# Patient Record
Sex: Male | Born: 1985 | Race: Black or African American | Hispanic: No | Marital: Single | Smoking: Current some day smoker
Health system: Southern US, Community
[De-identification: ages and names within clinical notes are randomized; demographics above are authoritative.]

## PROBLEM LIST (undated history)

## (undated) DIAGNOSIS — U071 COVID-19: Secondary | ICD-10-CM

---

## 2005-03-06 ENCOUNTER — Emergency Department (HOSPITAL_COMMUNITY): Admission: EM | Admit: 2005-03-06 | Discharge: 2005-03-06 | Payer: Self-pay | Admitting: Emergency Medicine

## 2009-07-29 ENCOUNTER — Emergency Department (HOSPITAL_COMMUNITY): Admission: EM | Admit: 2009-07-29 | Discharge: 2009-07-29 | Payer: Self-pay | Admitting: Emergency Medicine

## 2013-11-04 ENCOUNTER — Encounter (HOSPITAL_COMMUNITY): Payer: Self-pay | Admitting: Emergency Medicine

## 2013-11-04 ENCOUNTER — Emergency Department (HOSPITAL_COMMUNITY)
Admission: EM | Admit: 2013-11-04 | Discharge: 2013-11-04 | Disposition: A | Discharge status: Home / Self Care | Payer: Self-pay | Attending: Emergency Medicine | Admitting: Emergency Medicine

## 2013-11-04 DIAGNOSIS — K648 Other hemorrhoids: Secondary | ICD-10-CM | POA: Insufficient documentation

## 2013-11-04 DIAGNOSIS — K649 Unspecified hemorrhoids: Secondary | ICD-10-CM | POA: Insufficient documentation

## 2013-11-04 DIAGNOSIS — F17200 Nicotine dependence, unspecified, uncomplicated: Secondary | ICD-10-CM | POA: Insufficient documentation

## 2013-11-04 DIAGNOSIS — K5900 Constipation, unspecified: Secondary | ICD-10-CM | POA: Insufficient documentation

## 2013-11-04 DIAGNOSIS — K644 Residual hemorrhoidal skin tags: Secondary | ICD-10-CM | POA: Insufficient documentation

## 2013-11-04 MED ORDER — HYDROCODONE-ACETAMINOPHEN 5-325 MG PO TABS
1.0000 | ORAL_TABLET | Freq: Once | ORAL | Status: AC
  Administered 2013-11-04: 1 via ORAL
  Filled 2013-11-04: qty 1

## 2013-11-04 MED ORDER — HYDROCORTISONE ACETATE 25 MG RE SUPP: 25.0000 mg | Freq: Two times a day (BID) | RECTAL | Status: DC

## 2013-11-04 MED ORDER — HYDROCODONE-ACETAMINOPHEN 5-325 MG PO TABS
1.0000 | ORAL_TABLET | Freq: Four times a day (QID) | ORAL | Status: DC | PRN
Start: 2013-11-04 — End: 2016-12-22

## 2013-11-04 NOTE — Discharge Instructions (Signed)
Soak in a tub of warm water for 30 minutes several times a day. Take miralax daily to prevent constipation. Use the suppositories as directed. You need to see a surgeon about your hemorrhoids, I gave you two numbers to call.    Hemorrhoids Hemorrhoids are swollen veins around the rectum or anus. There are two types of hemorrhoids:   Internal hemorrhoids. These occur in the veins just inside the rectum. They may poke through to the outside and become irritated and painful.  External hemorrhoids. These occur in the veins outside the anus and can be felt as a painful swelling or hard lump near the anus. CAUSES  Pregnancy.   Obesity.   Constipation or diarrhea.   Straining to have a bowel movement.   Sitting for long periods on the toilet.  Heavy lifting or other activity that caused you to strain.  Anal intercourse. SYMPTOMS   Pain.   Anal itching or irritation.   Rectal bleeding.   Fecal leakage.   Anal swelling.   One or more lumps around the anus.  DIAGNOSIS  Your caregiver may be able to diagnose hemorrhoids by visual examination. Other examinations or tests that may be performed include:   Examination of the rectal area with a gloved hand (digital rectal exam).   Examination of anal canal using a small tube (scope).   A blood test if you have lost a significant amount of blood.  A test to look inside the colon (sigmoidoscopy or colonoscopy). TREATMENT Most hemorrhoids can be treated at home. However, if symptoms do not seem to be getting better or if you have a lot of rectal bleeding, your caregiver may perform a procedure to help make the hemorrhoids get smaller or remove them completely. Possible treatments include:   Placing a rubber band at the base of the hemorrhoid to cut off the circulation (rubber band ligation).   Injecting a chemical to shrink the hemorrhoid (sclerotherapy).   Using a tool to burn the hemorrhoid (infrared light therapy).    Surgically removing the hemorrhoid (hemorrhoidectomy).   Stapling the hemorrhoid to block blood flow to the tissue (hemorrhoid stapling).  HOME CARE INSTRUCTIONS   Eat foods with fiber, such as whole grains, beans, nuts, fruits, and vegetables. Ask your doctor about taking products with added fiber in them (fibersupplements).  Increase fluid intake. Drink enough water and fluids to keep your urine clear or pale yellow.   Exercise regularly.   Go to the bathroom when you have the urge to have a bowel movement. Do not wait.   Avoid straining to have bowel movements.   Keep the anal area dry and clean. Use wet toilet paper or moist towelettes after a bowel movement.   Medicated creams and suppositories may be used or applied as directed.   Only take over-the-counter or prescription medicines as directed by your caregiver.   Take warm sitz baths for 15-20 minutes, 3-4 times a day to ease pain and discomfort.   Place ice packs on the hemorrhoids if they are tender and swollen. Using ice packs between sitz baths may be helpful.   Put ice in a plastic bag.   Place a towel between your skin and the bag.   Leave the ice on for 15-20 minutes, 3-4 times a day.   Do not use a donut-shaped pillow or sit on the toilet for long periods. This increases blood pooling and pain.  SEEK MEDICAL CARE IF:  You have increasing pain and swelling that  is not controlled by treatment or medicine.  You have uncontrolled bleeding.  You have difficulty or you are unable to have a bowel movement.  You have pain or inflammation outside the area of the hemorrhoids. MAKE SURE YOU:  Understand these instructions.  Will watch your condition.  Will get help right away if you are not doing well or get worse. Document Released: 03/09/2000 Document Revised: 02/27/2012 Document Reviewed: 01/15/2012 [facility] Patient Information 2015 [place], [place]. This information is not intended to  replace advice given to you by your health care provider. Make sure you discuss any questions you have with your health care provider.

## 2013-11-04 NOTE — ED Notes (Signed)
Hemorrhoids per pt. Started over a course of a year, now having problems with them per pt.

## 2013-11-04 NOTE — ED Provider Notes (Signed)
CSN: [id number]     Arrival date & time 11/04/13  2004 History  This chart was scribed for Ward Givens, MD by Milly Jakob, ED Scribe. The patient was seen in room APA03/APA03. Patient's care was started at 9:08 PM.   Chief Complaint  Patient presents with  . Hemorrhoids   The history is provided by the patient. No language interpreter was used.   HPI Comments: Phillip Bolton is a 28 y.o. male who presents to the Emergency Department complaining of an intermittent hemorrhoids which began a year ago. He states that this has become more painful recently. He reports sometimes there is some bleeding mainly when he has a bowel movement. He reports that he believes that he got his hemorrhoid when he was straining to lift a copier. He reports associated constipation. He reports using Epsom Salt soaks in his tub and Preporation H with minimal relief at home. He has never been evaluated for his hemorrhoids before. He states he has some mild constipation at times. He reports smoking about 0.5 PPD. He drinks occasionally. He washes cars for a living.   PCP none   History reviewed. No pertinent past medical history. History reviewed. No pertinent past surgical history. No family history on file. History  Substance Use Topics  . Smoking status: Current Every Day Smoker    Types: Cigarettes  . Smokeless tobacco: Not on file  . Alcohol Use: Yes  smokes 1/2 ppd Employed washing cars   Review of Systems  All other systems reviewed and are negative.  A complete 10 system review of systems was obtained and all systems are negative except as noted in the HPI and PMH.   Allergies  Review of patient's allergies indicates no known allergies.  Home Medications   Prior to Admission medications   Medication Sig Start Date End Date Taking? Authorizing Provider  HYDROcodone-acetaminophen (NORCO) 5-325 MG per tablet Take 1 tablet by mouth every 6 (six) hours as needed for moderate pain. 11/04/13   Ward Givens, MD  hydrocortisone (ANUSOL-HC) 25 MG suppository Place 1 suppository (25 mg total) rectally 2 (two) times daily. 11/04/13   Ward Givens, MD   Triage Vitals: BP 155/98  Pulse 67  Temp(Src) 97.8 F (36.6 C) (Oral)  Resp 24  Ht 5\' 8"  (1.727 m)  Wt 155 lb (70.308 kg)  BMI 23.57 kg/m2  SpO2 100%  Vital signs normal   Physical Exam  Nursing note and vitals reviewed. Constitutional: He is oriented to person, place, and time. He appears well-developed and well-nourished. No distress.  HENT:  Head: Normocephalic and atraumatic.  Right Ear: External ear normal.  Left Ear: External ear normal.  Nose: Nose normal.  Eyes: Conjunctivae and EOM are normal. Pupils are equal, round, and reactive to light.  Neck: Normal range of motion. Neck supple. No tracheal deviation present.  Pulmonary/Chest: Effort normal. No respiratory distress.  Genitourinary:     Hemorrhoids completely surrounding the rectum with some maceration of the inner hemorrhoids with mild bleeding.   Musculoskeletal: Normal range of motion.  Neurological: He is alert and oriented to person, place, and time.  Skin: Skin is warm and dry.  Psychiatric: He has a normal mood and affect. His behavior is normal.    ED Course  Procedures (including critical care time)  Medications  HYDROcodone-acetaminophen (NORCO/VICODIN) 5-325 MG per tablet 1 tablet (1 tablet Oral Given 11/04/13 2136)    DIAGNOSTIC STUDIES: Oxygen Saturation is 100% on room air,  normal by my interpretation.    COORDINATION OF CARE: 9:17 PM-Discussed treatment plan which includes pain medication, suppositories and Miralax to prevent constipation with pt at bedside and pt agreed to plan. He was also given surgery referral.   Labs Review Labs Reviewed - No data to display  Imaging Review No results found.   EKG Interpretation None      MDM   Final diagnoses:  External hemorrhoid, bleeding   Discharge Medication List as of 11/04/2013  9:16  PM    START taking these medications   Details  HYDROcodone-acetaminophen (NORCO) 5-325 MG per tablet Take 1 tablet by mouth every 6 (six) hours as needed for moderate pain., Starting 11/04/2013, Until Discontinued, Print    hydrocortisone (ANUSOL-HC) 25 MG suppository Place 1 suppository (25 mg total) rectally 2 (two) times daily., Starting 11/04/2013, Until Discontinued, Print        Plan discharge    I personally performed the services described in this documentation, which was scribed in my presence. The recorded information has been reviewed and considered.  Devoria AlbeIva Daimien Patmon, MD, FACEP   Ward GivensIva L Ryah Cribb, MD 11/05/13 [phone]

## 2016-12-22 ENCOUNTER — Ambulatory Visit (INDEPENDENT_AMBULATORY_CARE_PROVIDER_SITE_OTHER): Payer: Self-pay | Admitting: Urgent Care

## 2016-12-22 ENCOUNTER — Encounter: Payer: Self-pay | Admitting: Urgent Care

## 2016-12-22 VITALS — BP 110/80 | HR 67 | Temp 97.8°F | Resp 17 | Ht 68.0 in | Wt 149.0 lb

## 2016-12-22 DIAGNOSIS — K6289 Other specified diseases of anus and rectum: Secondary | ICD-10-CM

## 2016-12-22 DIAGNOSIS — K644 Residual hemorrhoidal skin tags: Secondary | ICD-10-CM

## 2016-12-22 MED ORDER — KETOROLAC TROMETHAMINE 60 MG/2ML IM SOLN
60.0000 mg | Freq: Once | INTRAMUSCULAR | Status: AC
  Administered 2016-12-22: 60 mg via INTRAMUSCULAR

## 2016-12-22 MED ORDER — CELECOXIB 100 MG PO CAPS: 100.0000 mg | ORAL_CAPSULE | Freq: Two times a day (BID) | ORAL | 1 refills | Status: DC

## 2016-12-22 MED ORDER — HYDROCORTISONE 1 % EX CREA: 1.0000 "application " | TOPICAL_CREAM | Freq: Two times a day (BID) | CUTANEOUS | 0 refills | Status: DC

## 2016-12-22 NOTE — Progress Notes (Signed)
    MRN: [id number] DOB: 04/18/1985  Subjective:   Phillip Bolton is a 31 y.o. male presenting for chief complaint of Hemorrhoids  Reports 5 year history of persistent painful mass over anus. Symptoms started following an accident wherein he was carry a very heavy item. Unfortunately, it dropped and fell onto his body, he heard a loud pop and has had a protruding painful mass since then. He has been seen before in the ER, has tried topical hydrocortisone, Preparation H without minimal relief. Today, reports that the mass has decreased in size but still persists. It feels quite painful during defecation, has intermittent blood in his stools. Denies fever, n/v, chills, genital rash.  Masud has a current medication list which includes the following prescription(s): hydrocodone-acetaminophen and hydrocortisone. Also has No Known Allergies.  Quientin denies past medical and surgical history.   Objective:   Vitals: BP 110/80 (BP Location: Right Arm, Patient Position: Sitting, Cuff Size: Normal)   Pulse 67   Temp 97.8 F (36.6 C) (Oral)   Resp 17   Ht  (1.727 m)   Wt 149 lb (67.6 kg)   SpO2 100%   BMI 22.66 kg/m   Physical Exam  Constitutional: He is oriented to person, place, and time. He appears well-developed and well-nourished.  Cardiovascular: Normal rate.   Pulmonary/Chest: Effort normal.  Genitourinary: Rectal exam shows external hemorrhoid (4 non-thrombosed external hemorrhoids), mass (there is a protruding mass from the anus that is tender and not amenable to a full exam) and tenderness. Rectal exam shows no fissure and anal tone normal.  Neurological: He is alert and oriented to person, place, and time.   Assessment and Plan :   Case precepted with Dr. Leretha Pol.  1. Mass of anus 2. Anal pain 3. External hemorrhoid - Will try to obtain an OV for patient with [facility] early next week. In the meantime, patient is to use Celebrex, topical hydrocortisone.    Wallis Bamberg, PA-C Primary Care at [facility] [phone] 12/22/2016  9:34 AM

## 2016-12-22 NOTE — Patient Instructions (Addendum)
Please report to the emergency room if you start to have frank bleeding, worsening pain, inability to poop. Otherwise, we will try to schedule you for a consult with [facility].    IF you received an x-ray today, you will receive an invoice from [facility]. Please contact [facility] at [phone] with questions or concerns regarding your invoice.   IF you received labwork today, you will receive an invoice from [facility]. Please contact [facility] at [phone] with questions or concerns regarding your invoice.   Our billing staff will not be able to assist you with questions regarding bills from these companies.  You will be contacted with the lab results as soon as they are available. The fastest way to get your results is to activate your My Chart account. Instructions are located on the last page of this paperwork. If you have not heard from [place] regarding the results in 2 weeks, please contact this office.

## 2016-12-24 ENCOUNTER — Ambulatory Visit: Payer: Self-pay | Admitting: Urgent Care

## 2016-12-28 ENCOUNTER — Other Ambulatory Visit: Payer: Self-pay | Admitting: *Deleted

## 2016-12-31 ENCOUNTER — Other Ambulatory Visit: Payer: Self-pay | Admitting: *Deleted

## 2017-09-20 ENCOUNTER — Encounter (HOSPITAL_COMMUNITY): Payer: Self-pay | Admitting: Emergency Medicine

## 2017-09-20 ENCOUNTER — Other Ambulatory Visit: Payer: Self-pay

## 2017-09-20 ENCOUNTER — Emergency Department (HOSPITAL_COMMUNITY)
Admission: EM | Admit: 2017-09-20 | Discharge: 2017-09-20 | Disposition: A | Discharge status: Home / Self Care | Payer: BLUE CROSS/BLUE SHIELD | Attending: Emergency Medicine | Admitting: Emergency Medicine

## 2017-09-20 ENCOUNTER — Emergency Department (HOSPITAL_COMMUNITY): Payer: BLUE CROSS/BLUE SHIELD

## 2017-09-20 DIAGNOSIS — Y999 Unspecified external cause status: Secondary | ICD-10-CM | POA: Diagnosis not present

## 2017-09-20 DIAGNOSIS — Z23 Encounter for immunization: Secondary | ICD-10-CM | POA: Diagnosis not present

## 2017-09-20 DIAGNOSIS — S92511A Displaced fracture of proximal phalanx of right lesser toe(s), initial encounter for closed fracture: Secondary | ICD-10-CM | POA: Insufficient documentation

## 2017-09-20 DIAGNOSIS — F17210 Nicotine dependence, cigarettes, uncomplicated: Secondary | ICD-10-CM | POA: Diagnosis not present

## 2017-09-20 DIAGNOSIS — Z79899 Other long term (current) drug therapy: Secondary | ICD-10-CM | POA: Insufficient documentation

## 2017-09-20 DIAGNOSIS — W230XXA Caught, crushed, jammed, or pinched between moving objects, initial encounter: Secondary | ICD-10-CM | POA: Diagnosis not present

## 2017-09-20 DIAGNOSIS — Y9301 Activity, walking, marching and hiking: Secondary | ICD-10-CM | POA: Insufficient documentation

## 2017-09-20 DIAGNOSIS — Y929 Unspecified place or not applicable: Secondary | ICD-10-CM | POA: Diagnosis not present

## 2017-09-20 MED ORDER — TETANUS-DIPHTH-ACELL PERTUSSIS 5-2.5-18.5 LF-MCG/0.5 IM SUSP
0.5000 mL | Freq: Once | INTRAMUSCULAR | Status: AC
  Administered 2017-09-20: 0.5 mL via INTRAMUSCULAR
  Filled 2017-09-20: qty 0.5

## 2017-09-20 MED ORDER — HYDROCODONE-ACETAMINOPHEN 5-325 MG PO TABS: 1.0000 | ORAL_TABLET | Freq: Four times a day (QID) | ORAL | 0 refills | Status: DC | PRN

## 2017-09-20 NOTE — ED Provider Notes (Signed)
[facility]   CSN: [id number] Arrival date & time: 09/20/17  0554     History   Chief Complaint Chief Complaint  Patient presents with  . Toe Injury    HPI Phillip Bolton is a 32 y.o. male.  HPI   Phillip Bolton is a 32 y.o. male, patient with no pertinent past medical history, presenting to the ED with right small toe injury that occurred yesterday.  Patient states he caught the toe on a piece of exercise equipment while walking.  Pain is moderate to severe, throbbing, radiating into the right lateral foot.  Denies numbness, weakness, other injuries, or any other complaints. Patient requests tetanus update as he states that recently expired.   History reviewed. No pertinent past medical history.  There are no active problems to display for this patient.   History reviewed. No pertinent surgical history.      Home Medications    Prior to Admission medications   Medication Sig Start Date End Date Taking? Authorizing Provider  celecoxib (CELEBREX) 100 MG capsule Take 1 capsule (100 mg total) by mouth 2 (two) times daily. Patient not taking: Reported on 09/20/2017 12/22/16   Wallis BambergMani, Mario, PA-C  HYDROcodone-acetaminophen (NORCO/VICODIN) 5-325 MG tablet Take 1 tablet by mouth every 6 (six) hours as needed for severe pain. 09/20/17   Bubber Rothert, Hillard DankerShawn C, PA-C  hydrocortisone (ANUSOL-HC) 25 MG suppository Place 1 suppository (25 mg total) rectally 2 (two) times daily. Patient not taking: Reported on 09/20/2017 11/04/13   Devoria AlbeKnapp, Iva, MD  hydrocortisone cream 1 % Apply 1 application topically 2 (two) times daily. Patient not taking: Reported on 09/20/2017 12/22/16   Wallis BambergMani, Mario, PA-C    Family History History reviewed. No pertinent family history.  Social History Social History   Tobacco Use  . Smoking status: Current Every Day Smoker    Types: Cigarettes  . Smokeless tobacco: Never Used  Substance Use Topics  . Alcohol use: Yes   . Drug use: No     Allergies   Patient has no known allergies.   Review of Systems Review of Systems  Musculoskeletal: Positive for arthralgias and joint swelling.  Neurological: Negative for weakness and numbness.     Physical Exam Updated Vital Signs BP (!) 152/97   Pulse 67   Temp 98.4 F (36.9 C) (Oral)   Resp 16   Ht 5\' 8"  (1.727 m)   Wt 70.3 kg (155 lb)   SpO2 99%   BMI 23.57 kg/m   Physical Exam  Constitutional: He appears well-developed and well-nourished. No distress.  HENT:  Head: Normocephalic and atraumatic.  Eyes: Conjunctivae are normal.  Neck: Neck supple.  Cardiovascular: Normal rate, regular rhythm and intact distal pulses.  Pulmonary/Chest: Effort normal.  Musculoskeletal: He exhibits edema and tenderness. He exhibits no deformity.  Tenderness and slight swelling to the right fifth toe.  No tenderness past the MTP joint.  No tenderness or other abnormality noted to the remainder of the toes on the right foot.  Neurological: He is alert.  Sensation to light touch intact in the toes of the right foot. Motor function intact.  Ambulatory without assistance. Patient has some medial angulation of this toe, however, any deformity is matched on the left side.  Skin: Skin is warm and dry. Capillary refill takes less than 2 seconds. He is not diaphoretic. No pallor.  Psychiatric: He has a normal mood and affect. His behavior is normal.  Nursing note and  vitals reviewed.    ED Treatments / Results  Labs (all labs ordered are listed, but only abnormal results are displayed) Labs Reviewed - No data to display  EKG None  Radiology Dg Foot Complete Right  Result Date: 09/20/2017 CLINICAL DATA:  32 y/o M; right foot pain. Stubbed toe on furniture 1 day ago. EXAM: RIGHT FOOT COMPLETE - 3+ VIEW COMPARISON:  None. FINDINGS: Minimally displaced acute extra-articular oblique fracture of the fifth proximal phalanx diaphysis. No joint dislocation. Punctate  density projects over the soft tissues adjacent to the third distal phalanx, possibly foreign body or superficial debris. Lisfranc alignment is maintained. IMPRESSION: 1. Minimally displaced acute extra-articular fracture of the fifth proximal phalanx diaphysis. 2. Punctate density projects over the soft tissues adjacent to the third distal phalanx, possibly foreign body or superficial debris. Electronically Signed   By: Mitzi Hansen M.D.   On: 09/20/2017 06:50    Procedures Procedures (including critical care time)  Medications Ordered in ED Medications  Tdap (BOOSTRIX) injection 0.5 mL (0.5 mLs Intramuscular Given 09/20/17 0726)     Initial Impression / Assessment and Plan / ED Course  I have reviewed the triage vital signs and the nursing notes.  Pertinent labs & imaging results that were available during my care of the patient were reviewed by me and considered in my medical decision making (see chart for details).  Clinical Course as of Sep 20 740  Fri Sep 20, 2017  0716 X-ray read notes abnormal densities over the third toe, however, patient has no tenderness or abnormalities in this region.  DG Foot Complete Right [SJ]    Clinical Course User Index [SJ] Mirka Barbone C, PA-C    Patient presents with right toe injury.  Fracture noted on x-ray to the proximal phalanx.  Postop shoe and podiatry follow-up. The patient was given instructions for home care as well as return precautions. Patient voices understanding of these instructions, accepts the plan, and is comfortable with discharge.  Final Clinical Impressions(s) / ED Diagnoses   Final diagnoses:  Closed displaced fracture of proximal phalanx of lesser toe of right foot, initial encounter    ED Discharge Orders        Ordered    HYDROcodone-acetaminophen (NORCO/VICODIN) 5-325 MG tablet  Every 6 hours PRN     09/20/17 0720       Vihan Santagata, Hillard Danker, PA-C 09/20/17 0742    Palumbo, April, MD 09/20/17 1610

## 2017-09-20 NOTE — ED Notes (Signed)
PA shaw stated give work note for 4 days.

## 2017-09-20 NOTE — ED Notes (Signed)
Patient transported to X-ray 

## 2017-09-20 NOTE — ED Triage Notes (Signed)
Pt c/o hitting his right pinky toe on his exercise equipment. Toe shows some deformity. Pt rates pain 9/10 on arrival.

## 2017-09-20 NOTE — Discharge Instructions (Addendum)
You have been seen today for a toe injury.  There is a small, mildly displaced fracture noted on x-ray. Pain: Take 600 mg of ibuprofen every 6 hours or 440 mg (over the counter dose) to 500 mg (prescription dose) of naproxen every 12 hours for the next 3 days. After this time, these medications may be used as needed for pain. Take these medications with food to avoid upset stomach. Choose only one of these medications, do not take them together.  Tylenol: Should you continue to have additional pain while taking the ibuprofen or naproxen, you may add in tylenol as needed. Your daily total maximum amount of tylenol from all sources should be limited to 4000mg /day for persons without liver problems, or 2000mg /day for those with liver problems. Vicodin: May take Vicodin as needed for severe pain.  Do not drive or perform other dangerous activities while taking the Vicodin.  Please note that each pill of Vicodin contains 325 mg of Tylenol and the above dosage limits apply. Ice: May apply ice to the area over the next 24 hours for 15 minutes at a time to reduce swelling. Elevation: Keep the extremity elevated as often as possible to reduce pain and inflammation. Support: Wear the post-op shoe for support and comfort. Wear this until pain resolves. You will be weight-bearing as tolerated, which means you can slowly start to put weight on the extremity and increase amount and frequency as pain allows. Follow up: Follow-up with the podiatrist on this matter.

## 2017-09-23 ENCOUNTER — Encounter: Payer: Self-pay | Admitting: Podiatry

## 2017-09-23 ENCOUNTER — Ambulatory Visit: Payer: BLUE CROSS/BLUE SHIELD | Admitting: Podiatry

## 2017-09-23 VITALS — BP 133/89 | HR 66

## 2017-09-23 DIAGNOSIS — S92504A Nondisplaced unspecified fracture of right lesser toe(s), initial encounter for closed fracture: Secondary | ICD-10-CM

## 2017-09-23 MED ORDER — HYDROCODONE-ACETAMINOPHEN 5-325 MG PO TABS: 1.0000 | ORAL_TABLET | Freq: Four times a day (QID) | ORAL | 0 refills | Status: DC | PRN

## 2017-09-23 MED ORDER — MELOXICAM 15 MG PO TABS: 15.0000 mg | ORAL_TABLET | Freq: Every day | ORAL | 1 refills | Status: AC

## 2017-09-29 NOTE — Progress Notes (Signed)
   HPI: 32 year old male presenting today as a new patient with a chief complaint of an injury to the right 5th toe that occurred one week ago. He states he hit the toe on exercise equipment which caused significant pain. He was seen in the ED on 09/20/17 and was diagnosed with a fractured fifth proximal phalanx and was given a prescription for Vicodin for pain. He states he is still unable to wear shoes because it increases the pain. Patient is here for further evaluation and treatment.   No past medical history on file.   Physical Exam: General: The patient is alert and oriented x3 in no acute distress.  Dermatology: Skin is warm, dry and supple bilateral lower extremities. Negative for open lesions or macerations.  Vascular: Palpable pedal pulses bilaterally. No edema or erythema noted. Capillary refill within normal limits.  Neurological: Epicritic and protective threshold grossly intact bilaterally.   Musculoskeletal Exam: Pain with palpation to the 5th toe of the right foot. Range of motion within normal limits to all pedal and ankle joints bilateral. Muscle strength 5/5 in all groups bilateral.   Radiographic Exam from ED on 09/20/17: 1. Minimally displaced acute extra-articular fracture of the fifth proximal phalanx diaphysis. 2. Punctate density projects over the soft tissues adjacent to the third distal phalanx, possibly foreign body or superficial debris.  Assessment: 1. Closed, minimally displaced fracture of the proximal phalanx of the 5th toe of the right foot.    Plan of Care:  1. Patient evaluated. X-Rays from ED reviewed.  2. Post op shoe dispensed. Weightbearing as tolerated.  3. Prescription for Meloxicam provided to patient.  4. Refill prescription for Vicodin 5/325 mg provided to patient.  5. Return to clinic in 8 weeks for follow up X-Ray.       Felecia ShellingBrent M. Savaughn Karwowski, DPM [facility]  Dr. Felecia ShellingBrent M. Danna Sewell, DPM    [address]                                         [place], [place] [address]                Office [phone]  Fax [phone]

## 2017-09-30 ENCOUNTER — Ambulatory Visit (INDEPENDENT_AMBULATORY_CARE_PROVIDER_SITE_OTHER): Payer: BLUE CROSS/BLUE SHIELD | Admitting: Podiatry

## 2017-09-30 ENCOUNTER — Encounter: Payer: Self-pay | Admitting: Podiatry

## 2017-09-30 DIAGNOSIS — S92501D Displaced unspecified fracture of right lesser toe(s), subsequent encounter for fracture with routine healing: Secondary | ICD-10-CM

## 2017-10-03 NOTE — Progress Notes (Signed)
   HPI: 32 year old male presenting today for follow up evaluation of a right fifth toe fracture. He states he needs to discuss a work note. There are no new complaints at this time. Patient is here for further evaluation and treatment.   History reviewed. No pertinent past medical history.   Physical Exam: General: The patient is alert and oriented x3 in no acute distress.  Dermatology: Skin is warm, dry and supple bilateral lower extremities. Negative for open lesions or macerations.  Vascular: Palpable pedal pulses bilaterally. No edema or erythema noted. Capillary refill within normal limits.  Neurological: Epicritic and protective threshold grossly intact bilaterally.   Musculoskeletal Exam: Pain with palpation to the 5th toe of the right foot. Range of motion within normal limits to all pedal and ankle joints bilateral. Muscle strength 5/5 in all groups bilateral.   Assessment: 1. Closed, minimally displaced fracture of the proximal phalanx of the 5th toe of the right foot.    Plan of Care:  1. Patient evaluated.  2. Detailed noted for work was provided. 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  3. Continue weightbearing in post op shoe.  4. Continue taking Meloxicam daily and Vicodin 5/325 mg as needed.  5. Return to clinic in 8 weeks for follow up X-Rays.        Felecia ShellingBrent M. Evans, DPM [facility]  Dr. Felecia ShellingBrent M. Evans, DPM    [address]                                        [place], [place] [address]                Office [phone]  Fax [phone]

## 2017-10-08 ENCOUNTER — Encounter: Payer: Self-pay | Admitting: Podiatry

## 2017-11-18 ENCOUNTER — Ambulatory Visit: Payer: BLUE CROSS/BLUE SHIELD | Admitting: Podiatry

## 2017-11-27 ENCOUNTER — Ambulatory Visit (INDEPENDENT_AMBULATORY_CARE_PROVIDER_SITE_OTHER): Payer: BLUE CROSS/BLUE SHIELD | Admitting: Podiatry

## 2017-11-27 ENCOUNTER — Encounter: Payer: Self-pay | Admitting: Podiatry

## 2017-11-27 ENCOUNTER — Ambulatory Visit (INDEPENDENT_AMBULATORY_CARE_PROVIDER_SITE_OTHER): Payer: BLUE CROSS/BLUE SHIELD

## 2017-11-27 DIAGNOSIS — S92501D Displaced unspecified fracture of right lesser toe(s), subsequent encounter for fracture with routine healing: Secondary | ICD-10-CM | POA: Diagnosis not present

## 2017-11-29 NOTE — Progress Notes (Signed)
   HPI: 32 year old male presenting today for follow up evaluation of a right fifth toe fracture. He states he is doing well overall. He reports pain only when pressure is applied to the toe. He denies any new complaints at this time. Patient is here for further evaluation and treatment.   History reviewed. No pertinent past medical history.   Physical Exam: General: The patient is alert and oriented x3 in no acute distress.  Dermatology: Skin is warm, dry and supple bilateral lower extremities. Negative for open lesions or macerations.  Vascular: Palpable pedal pulses bilaterally. No edema or erythema noted. Capillary refill within normal limits.  Neurological: Epicritic and protective threshold grossly intact bilaterally.   Musculoskeletal Exam: Pain with palpation to the 5th toe of the right foot. Range of motion within normal limits to all pedal and ankle joints bilateral. Muscle strength 5/5 in all groups bilateral.   Radiographic Exam: Minimally displaced extra-articular fracture of the fifth proximal phalanx with routine healing.   Assessment: 1. Closed, minimally displaced fracture of the proximal phalanx of the 5th toe of the right foot.    Plan of Care:  1. Patient evaluated. X-Rays reviewed.  2. Resume wearing good shoe gear.  3. Return to work full duty with no restrictions beginning 12/02/17.  4. Return to clinic as needed.      Felecia Shelling, DPM [facility]  Dr. Felecia Shelling, DPM    [address] [place], [place] [address]                Office [phone]  Fax [phone]

## 2018-01-02 ENCOUNTER — Telehealth: Payer: Self-pay | Admitting: *Deleted

## 2019-02-11 ENCOUNTER — Other Ambulatory Visit: Payer: Self-pay | Admitting: Podiatry

## 2019-02-13 NOTE — Telephone Encounter (Signed)
Pt has not been seen in office in, over 1 year, needs an appt.

## 2019-03-03 NOTE — Telephone Encounter (Signed)
Thank you :)

## 2019-04-07 ENCOUNTER — Other Ambulatory Visit: Payer: Self-pay

## 2019-04-07 ENCOUNTER — Emergency Department (HOSPITAL_COMMUNITY)
Admission: EM | Admit: 2019-04-07 | Discharge: 2019-04-07 | Disposition: A | Discharge status: Home / Self Care | Payer: BC Managed Care – PPO | Attending: Emergency Medicine | Admitting: Emergency Medicine

## 2019-04-07 ENCOUNTER — Encounter (HOSPITAL_COMMUNITY): Payer: Self-pay | Admitting: Emergency Medicine

## 2019-04-07 DIAGNOSIS — Z79899 Other long term (current) drug therapy: Secondary | ICD-10-CM | POA: Diagnosis not present

## 2019-04-07 DIAGNOSIS — F17210 Nicotine dependence, cigarettes, uncomplicated: Secondary | ICD-10-CM | POA: Insufficient documentation

## 2019-04-07 DIAGNOSIS — U071 COVID-19: Secondary | ICD-10-CM | POA: Diagnosis not present

## 2019-04-07 DIAGNOSIS — R0981 Nasal congestion: Secondary | ICD-10-CM | POA: Diagnosis present

## 2019-04-07 LAB — POC SARS CORONAVIRUS 2 AG -  ED: SARS Coronavirus 2 Ag: POSITIVE — AB

## 2019-04-07 NOTE — ED Provider Notes (Signed)
[facility] EMERGENCY DEPARTMENT Provider Note   CSN: [id number] Arrival date & time: 04/07/19  0736     History Chief Complaint  Patient presents with  . Nasal Congestion    Phillip Bolton is a 34 y.o. male.  Patient is a 34 year old male with no significant past medical history.  He presents today with complaints of nasal congestion and not feeling well for the past 2 days.  He denies any chest pain or difficulty breathing.  He denies any fevers.  He states there have been several individuals at work who have been diagnosed with COVID-19.  He denies any sick family members.  He denies any aggravating or alleviating factors.  The history is provided by the patient.       History reviewed. No pertinent past medical history.  There are no problems to display for this patient.   No past surgical history on file.     No family history on file.  Social History   Tobacco Use  . Smoking status: Current Every Day Smoker    Types: Cigarettes  . Smokeless tobacco: Never Used  Substance Use Topics  . Alcohol use: Yes  . Drug use: No    Home Medications Prior to Admission medications   Medication Sig Start Date End Date Taking? Authorizing Provider  celecoxib (CELEBREX) 100 MG capsule Take 1 capsule (100 mg total) by mouth 2 (two) times daily. 12/22/16   Jaynee Eagles, PA-C  HYDROcodone-acetaminophen (NORCO/VICODIN) 5-325 MG tablet Take 1 tablet by mouth every 6 (six) hours as needed for moderate pain. 09/23/17   Edrick Kins, DPM  hydrocortisone (ANUSOL-HC) 25 MG suppository Place 1 suppository (25 mg total) rectally 2 (two) times daily. 11/04/13   Rolland Porter, MD  hydrocortisone cream 1 % Apply 1 application topically 2 (two) times daily. 12/22/16   Jaynee Eagles, PA-C    Allergies    Patient has no known allergies.  Review of Systems   Review of Systems  All other systems reviewed and are negative.   Physical Exam Updated Vital Signs BP 119/67 (BP  Location: Left Arm)   Pulse 61   Temp 97.7 F (36.5 C) (Oral)   Resp 16   SpO2 100%   Physical Exam Vitals and nursing note reviewed.  Constitutional:      General: He is not in acute distress.    Appearance: He is well-developed. He is not diaphoretic.  HENT:     Head: Normocephalic and atraumatic.     Mouth/Throat:     Mouth: Mucous membranes are moist.     Pharynx: No oropharyngeal exudate or posterior oropharyngeal erythema.  Cardiovascular:     Rate and Rhythm: Normal rate and regular rhythm.     Heart sounds: No murmur. No friction rub.  Pulmonary:     Effort: Pulmonary effort is normal. No respiratory distress.     Breath sounds: Normal breath sounds. No wheezing or rales.  Abdominal:     General: Bowel sounds are normal. There is no distension.     Palpations: Abdomen is soft.     Tenderness: There is no abdominal tenderness.  Musculoskeletal:        General: Normal range of motion.     Cervical back: Normal range of motion and neck supple.  Skin:    General: Skin is warm and dry.  Neurological:     Mental Status: He is alert and oriented to person, place, and time.     Coordination:  Coordination normal.     ED Results / Procedures / Treatments   Labs (all labs ordered are listed, but only abnormal results are displayed) Labs Reviewed  POC SARS CORONAVIRUS 2 AG -  ED - Abnormal; Notable for the following components:      Result Value   SARS Coronavirus 2 Ag POSITIVE (*)    All other components within normal limits    EKG None  Radiology No results found.  Procedures Procedures (including critical care time)  Medications Ordered in ED Medications - No data to display  ED Course  I have reviewed the triage vital signs and the nursing notes.  Pertinent labs & imaging results that were available during my care of the patient were reviewed by me and considered in my medical decision making (see chart for details).    MDM  Rules/Calculators/A&P  Patient presenting with URI symptoms and nasal swab positive for COVID-19.  Patient's vital signs are stable with no hypoxia.  He appears clinically well.  I believe he is appropriate for discharge.  He will be given a work excuse and Barr[employer].  To return as needed.  Phillip Bolton was evaluated in Emergency Department on 04/07/2019 for the symptoms described in the history of present illness. He was evaluated in the context of the global COVID-19 pandemic, which necessitated consideration that the patient might be at risk for infection with the SARS-CoV-2 virus that causes COVID-19. Institutional protocols and algorithms that pertain to the evaluation of patients at risk for COVID-19 are in a state of rapid change based on information released by regulatory bodies including the CDC and federal and state organizations. These policies and algorithms were followed during the patient's care in the ED.  Final Clinical Impression(s) / ED Diagnoses Final diagnoses:  None    Rx / DC Orders ED Discharge Orders    None       Geoffery Lyons, MD 04/07/19 [phone]

## 2019-04-07 NOTE — ED Triage Notes (Signed)
Pt reports since yesterday has had nasal congestion and no taste.

## 2019-04-07 NOTE — ED Notes (Signed)
Pt dc'd home w/all belongings, a/o x4, ambulatory on dc  

## 2019-04-07 NOTE — Discharge Instructions (Signed)
Drink plenty of fluids and get plenty of rest.  Tylenol 1000 mg every 6 hours as needed for pain or fever.  Take over-the-counter medications as needed for relief of symptoms.  Isolate at home until you feel better plus one week.       Infection Prevention Recommendations for Individuals Confirmed to have, or Being Evaluated for, 2019 Novel Coronavirus (COVID-19) Infection Who Receive Care at Home  Individuals who are confirmed to have, or are being evaluated for, COVID-19 should follow the prevention steps below until a healthcare provider or local or state health department says they can return to normal activities.  Stay home except to get medical care You should restrict activities outside your home, except for getting medical care. Do not go to work, school, or public areas, and do not use public transportation or taxis.  Call ahead before visiting your doctor Before your medical appointment, call the healthcare provider and tell them that you have, or are being evaluated for, COVID-19 infection. This will help the healthcare provider's office take steps to keep other people from getting infected. Ask your healthcare provider to call the local or state health department.  Monitor your symptoms Seek prompt medical attention if your illness is worsening (e.g., difficulty breathing). Before going to your medical appointment, call the healthcare provider and tell them that you have, or are being evaluated for, COVID-19 infection. Ask your healthcare provider to call the local or state health department.  Wear a facemask You should wear a facemask that covers your nose and mouth when you are in the same room with other people and when you visit a healthcare provider. People who live with or visit you should also wear a facemask while they are in the same room with you.  Separate yourself from other people in your home As much as possible, you should stay in a different room  from other people in your home. Also, you should use a separate bathroom, if available.  Avoid sharing household items You should not share dishes, drinking glasses, cups, eating utensils, towels, bedding, or other items with other people in your home. After using these items, you should wash them thoroughly with soap and water.  Cover your coughs and sneezes Cover your mouth and nose with a tissue when you cough or sneeze, or you can cough or sneeze into your sleeve. Throw used tissues in a lined trash can, and immediately wash your hands with soap and water for at least 20 seconds or use an alcohol-based hand rub.  Wash your [employer] your hands often and thoroughly with soap and water for at least 20 seconds. You can use an alcohol-based hand sanitizer if soap and water are not available and if your hands are not visibly dirty. Avoid touching your eyes, nose, and mouth with unwashed hands.   Prevention Steps for Caregivers and Household Members of Individuals Confirmed to have, or Being Evaluated for, COVID-19 Infection Being Cared for in the Home  If you live with, or provide care at home for, a person confirmed to have, or being evaluated for, COVID-19 infection please follow these guidelines to prevent infection:  Follow healthcare provider's instructions Make sure that you understand and can help the patient follow any healthcare provider instructions for all care.  Provide for the patient's basic needs You should help the patient with basic needs in the home and provide support for getting groceries, prescriptions, and other personal needs.  Monitor the patient's symptoms If they  are getting sicker, call his or her medical provider and tell them that the patient has, or is being evaluated for, COVID-19 infection. This will help the healthcare provider's office take steps to keep other people from getting infected. Ask the healthcare provider to call the local or state  health department.  Limit the number of people who have contact with the patient If possible, have only one caregiver for the patient. Other household members should stay in another home or place of residence. If this is not possible, they should stay in another room, or be separated from the patient as much as possible. Use a separate bathroom, if available. Restrict visitors who do not have an essential need to be in the home.  Keep older adults, very young children, and other sick people away from the patient Keep older adults, very young children, and those who have compromised immune systems or chronic health conditions away from the patient. This includes people with chronic heart, lung, or kidney conditions, diabetes, and cancer.  Ensure good ventilation Make sure that shared spaces in the home have good air flow, such as from an air conditioner or an opened window, weather permitting.  Wash your hands often Wash your hands often and thoroughly with soap and water for at least 20 seconds. You can use an alcohol based hand sanitizer if soap and water are not available and if your hands are not visibly dirty. Avoid touching your eyes, nose, and mouth with unwashed hands. Use disposable paper towels to dry your hands. If not available, use dedicated cloth towels and replace them when they become wet.  Wear a facemask and gloves Wear a disposable facemask at all times in the room and gloves when you touch or have contact with the patient's blood, body fluids, and/or secretions or excretions, such as sweat, saliva, sputum, nasal mucus, vomit, urine, or feces.  Ensure the mask fits over your nose and mouth tightly, and do not touch it during use. Throw out disposable facemasks and gloves after using them. Do not reuse. Wash your hands immediately after removing your facemask and gloves. If your personal clothing becomes contaminated, carefully remove clothing and launder. Wash your hands  after handling contaminated clothing. Place all used disposable facemasks, gloves, and other waste in a lined container before disposing them with other household waste. Remove gloves and wash your hands immediately after handling these items.  Do not share dishes, glasses, or other household items with the patient Avoid sharing household items. You should not share dishes, drinking glasses, cups, eating utensils, towels, bedding, or other items with a patient who is confirmed to have, or being evaluated for, COVID-19 infection. After the person uses these items, you should wash them thoroughly with soap and water.  Wash laundry thoroughly Immediately remove and wash clothes or bedding that have blood, body fluids, and/or secretions or excretions, such as sweat, saliva, sputum, nasal mucus, vomit, urine, or feces, on them. Wear gloves when handling laundry from the patient. Read and follow directions on labels of laundry or clothing items and detergent. In general, wash and dry with the warmest temperatures recommended on the label.  Clean all areas the individual has used often Clean all touchable surfaces, such as counters, tabletops, doorknobs, bathroom fixtures, toilets, phones, keyboards, tablets, and bedside tables, every day. Also, clean any surfaces that may have blood, body fluids, and/or secretions or excretions on them. Wear gloves when cleaning surfaces the patient has come in contact with. Use a  diluted bleach solution (e.g., dilute bleach with 1 part bleach and 10 parts water) or a household disinfectant with a label that says EPA-registered for coronaviruses. To make a bleach solution at home, add 1 tablespoon of bleach to 1 quart (4 cups) of water. For a larger supply, add  cup of bleach to 1 gallon (16 cups) of water. Read labels of cleaning products and follow recommendations provided on product labels. Labels contain instructions for safe and effective use of the cleaning product  including precautions you should take when applying the product, such as wearing gloves or eye protection and making sure you have good ventilation during use of the product. Remove gloves and wash hands immediately after cleaning.  Monitor yourself for signs and symptoms of illness Caregivers and household members are considered close contacts, should monitor their health, and will be asked to limit movement outside of the home to the extent possible. Follow the monitoring steps for close contacts listed on the symptom monitoring form.   ? If you have additional questions, contact your local health department or call the epidemiologist on call at [phone] (available 24/7). ? This guidance is subject to change. For the most up-to-date guidance from [facility], please refer to their website: YouBlogs.pl

## 2019-04-18 ENCOUNTER — Other Ambulatory Visit: Payer: Self-pay

## 2019-04-18 ENCOUNTER — Encounter (HOSPITAL_COMMUNITY): Payer: Self-pay | Admitting: Emergency Medicine

## 2019-04-18 ENCOUNTER — Emergency Department (HOSPITAL_COMMUNITY)
Admission: EM | Admit: 2019-04-18 | Discharge: 2019-04-18 | Disposition: A | Discharge status: Home / Self Care | Payer: BC Managed Care – PPO | Attending: Emergency Medicine | Admitting: Emergency Medicine

## 2019-04-18 DIAGNOSIS — Z711 Person with feared health complaint in whom no diagnosis is made: Secondary | ICD-10-CM

## 2019-04-18 DIAGNOSIS — F17210 Nicotine dependence, cigarettes, uncomplicated: Secondary | ICD-10-CM | POA: Diagnosis not present

## 2019-04-18 DIAGNOSIS — Z7189 Other specified counseling: Secondary | ICD-10-CM

## 2019-04-18 DIAGNOSIS — Z8616 Personal history of COVID-19: Secondary | ICD-10-CM | POA: Insufficient documentation

## 2019-04-18 DIAGNOSIS — Z113 Encounter for screening for infections with a predominantly sexual mode of transmission: Secondary | ICD-10-CM | POA: Diagnosis not present

## 2019-04-18 DIAGNOSIS — Z79899 Other long term (current) drug therapy: Secondary | ICD-10-CM | POA: Diagnosis not present

## 2019-04-18 HISTORY — DX: COVID-19: U07.1

## 2019-04-18 MED ORDER — CEFTRIAXONE SODIUM 500 MG IJ SOLR
250.0000 mg | Freq: Once | INTRAMUSCULAR | Status: AC
  Administered 2019-04-18: 14:00:00 250 mg via INTRAMUSCULAR
  Filled 2019-04-18: qty 500

## 2019-04-18 MED ORDER — AZITHROMYCIN 250 MG PO TABS
1000.0000 mg | ORAL_TABLET | Freq: Once | ORAL | Status: AC
  Administered 2019-04-18: 1000 mg via ORAL
  Filled 2019-04-18: qty 4

## 2019-04-18 NOTE — ED Notes (Signed)
Patient verbalizes understanding of discharge instructions. Opportunity for questioning and answers were provided. Armband removed by staff, pt discharged from ED. Ambulated out to lobby  

## 2019-04-18 NOTE — ED Triage Notes (Addendum)
Pt diagnosed with COVID on 1/12.  Denies any symptoms.  States he was told to return on 1/23 for recheck.  Reviewed pt's discharge instructions with him from previous visit and explained that the ED does not do repeat testing for negative results.

## 2019-04-18 NOTE — Discharge Instructions (Addendum)
We will contact you with the results of your remaining lab work when it is available. Return to the ED if you start to develop shortness of breath, chest pain, leg swelling, abdominal pain or fever.

## 2019-04-18 NOTE — ED Provider Notes (Addendum)
[facility] EMERGENCY DEPARTMENT Provider Note   CSN: [id number] Arrival date & time: 04/18/19  1151     History Chief Complaint  Patient presents with  . Recheck from COVID    Phillip Bolton is a 34 y.o. male with a positive COVID-19 test on 04/07/2019 presenting to the ED for STD testing.  Initially patient was triaged as wanting a recheck Covid test to ensure negative results.  I informed patient that we are unable to obtain repeat Covid test if patient is asymptomatic and out of their quarantine period, as a repeat positive test is not indicative of continued viral shedding.  He then stated that he would like to be checked for STDs.  Patient initially said that he was asymptomatic and then said he was experiencing "feeling weird down there."  Also saying that his fiance had "some type of discharge" and wanted patient to get tested.  He denies any penile discharge, rashes or lesions, dysuria, abdominal pain, shortness of breath or cough.  HPI     Past Medical History:  Diagnosis Date  . COVID-19     There are no problems to display for this patient.   History reviewed. No pertinent surgical history.     No family history on file.  Social History   Tobacco Use  . Smoking status: Current Every Day Smoker    Types: Cigarettes  . Smokeless tobacco: Never Used  Substance Use Topics  . Alcohol use: Yes  . Drug use: No    Home Medications Prior to Admission medications   Medication Sig Start Date End Date Taking? Authorizing Provider  celecoxib (CELEBREX) 100 MG capsule Take 1 capsule (100 mg total) by mouth 2 (two) times daily. 12/22/16   Wallis Bamberg, PA-C  HYDROcodone-acetaminophen (NORCO/VICODIN) 5-325 MG tablet Take 1 tablet by mouth every 6 (six) hours as needed for moderate pain. 09/23/17   Felecia Shelling, DPM  hydrocortisone (ANUSOL-HC) 25 MG suppository Place 1 suppository (25 mg total) rectally 2 (two) times daily. 11/04/13   Devoria Albe, MD    hydrocortisone cream 1 % Apply 1 application topically 2 (two) times daily. 12/22/16   Wallis Bamberg, PA-C    Allergies    Patient has no known allergies.  Review of Systems   Review of Systems  Constitutional: Negative for chills and fever.  Respiratory: Negative for shortness of breath.   Genitourinary: Negative for discharge, penile pain, penile swelling and scrotal swelling.    Physical Exam Updated Vital Signs BP (!) 154/106 (BP Location: Right Arm)   Pulse 74   Temp 98 F (36.7 C) (Oral)   Resp 20   SpO2 100%   Physical Exam Vitals and nursing note reviewed.  Constitutional:      General: He is not in acute distress.    Appearance: He is well-developed. He is not diaphoretic.  HENT:     Head: Normocephalic and atraumatic.  Eyes:     General: No scleral icterus.    Conjunctiva/sclera: Conjunctivae normal.  Cardiovascular:     Rate and Rhythm: Normal rate and regular rhythm.     Heart sounds: Normal heart sounds.  Pulmonary:     Effort: Pulmonary effort is normal. No respiratory distress.  Genitourinary:    Comments: Patient declined. Musculoskeletal:     Cervical back: Normal range of motion.  Skin:    Findings: No rash.  Neurological:     Mental Status: He is alert.     ED Results /  Procedures / Treatments   Labs (all labs ordered are listed, but only abnormal results are displayed) Labs Reviewed  GC/CHLAMYDIA PROBE AMP ([place]) NOT AT [facility]    EKG None  Radiology No results found.  Procedures Procedures (including critical care time)  Medications Ordered in ED Medications  cefTRIAXone (ROCEPHIN) injection 250 mg (has no administration in time range)  azithromycin (ZITHROMAX) tablet 1,000 mg (has no administration in time range)    ED Course  I have reviewed the triage vital signs and the nursing notes.  Pertinent labs & imaging results that were available during my care of the patient were reviewed by me and considered in my medical  decision making (see chart for details).    MDM Rules/Calculators/A&P                      34 year old male presenting to the ED requesting STD test.  States that his fiance had vaginal discharge and he would like to be tested.  Reports vague discomfort in his GU area but denies any rashes, lesions, penile discharge or abdominal pain.  GC chlamydia probe obtained.  His vital signs are reassuring, he is not hypoxic, tachycardic or tachypneic.  Have him follow-up with the results of GC chlamydia probe when it is available.  He is deferring treatment at this time and would like to wait on results.  On recheck patient states he would like to be treated for gonorrhea and chlamydia today.  Patient is hemodynamically stable, in NAD, and able to ambulate in the ED. Evaluation does not show pathology that would require ongoing emergent intervention or inpatient treatment. I explained the diagnosis to the patient. Pain has been managed and has no complaints prior to discharge. Patient is comfortable with above plan and is stable for discharge at this time. All questions were answered prior to disposition. Strict return precautions for returning to the ED were discussed. Encouraged follow up with PCP.   An After Visit Summary was printed and given to the patient.   Portions of this note were generated with [employer]. Dictation errors may occur despite best attempts at proofreading.  Final Clinical Impression(s) / ED Diagnoses Final diagnoses:  Concern about STD in male without diagnosis  Counseled about COVID-19 virus infection    Rx / DC Orders ED Discharge Orders    None       Delia Heady, PA-C 04/18/19 1321    Long, Wonda [employer], MD 04/18/19 [phone]

## 2019-04-20 ENCOUNTER — Telehealth: Payer: Self-pay | Admitting: *Deleted

## 2019-04-20 LAB — GC/CHLAMYDIA PROBE AMP (~~LOC~~) NOT AT ARMC
Chlamydia: NEGATIVE
Neisseria Gonorrhea: NEGATIVE

## 2019-06-07 ENCOUNTER — Telehealth: Payer: BC Managed Care – PPO | Admitting: Family

## 2019-06-07 DIAGNOSIS — R300 Dysuria: Secondary | ICD-10-CM

## 2019-06-07 NOTE — Progress Notes (Signed)
Based on what you shared with me, I feel your condition warrants further evaluation and I recommend that you be seen for a face to face office visit.  Male bladder infections are not very common.  We worry about prostate or kidney conditions.  The standard of care is to examine the abdomen and kidneys, and to do a urine and blood test to make sure that something more serious is not going on.  We recommend that you see a provider today.  If your doctor's office is closed [facility] has the following Urgent Cares:    NOTE: If you entered your credit card information for this eVisit, you will not be charged. You may see a "hold" on your card for the $35 but that hold will drop off and you will not have a charge processed.   If you are having a true medical emergency please call 911.     For an urgent face to face visit, [facility] has four urgent care centers for your convenience:    NEW:  [facility] [phone] [address] [place], [place] [address] .  Monday - Friday 10 am - 6 pm    . [facility]    [phone]                  Get Driving Directions  [address] [place], [place] [address] . 10 am to 8 pm Monday-Friday . 12 pm to 8 pm Saturday-Sunday   . [facility]  [phone]                  Get Driving Directions  [address], [address] [place], [place] [address] . 8 am to 8 pm Monday-Friday . 9 am to 6 pm Saturday . 11 am to 6 pm Sunday   . [facility]  [phone]                  Get Driving Directions   [address] [place], [place] [address] . 8 am to 8 pm Monday-Friday . 8 am to 4 pm Saturday-Sunday    . [facility] [place]                    Get Driving Directions  [phone]  [address] [place], [place] [address]  . Monday-Friday, 12 PM to 6 PM    Your e-visit  answers were reviewed by a board certified advanced clinical practitioner to complete your personal care plan.  Thank you for using e-Visits.  

## 2019-07-14 ENCOUNTER — Encounter: Payer: Self-pay | Admitting: *Deleted

## 2019-07-15 ENCOUNTER — Ambulatory Visit: Payer: BC Managed Care – PPO | Admitting: Internal Medicine

## 2019-09-01 ENCOUNTER — Ambulatory Visit: Payer: BC Managed Care – PPO | Admitting: Internal Medicine

## 2019-11-23 ENCOUNTER — Telehealth: Payer: BC Managed Care – PPO | Admitting: Nurse Practitioner

## 2019-11-23 DIAGNOSIS — H5789 Other specified disorders of eye and adnexa: Secondary | ICD-10-CM

## 2019-11-23 NOTE — Progress Notes (Signed)
Based on what you shared with me it looks like you have corneal laceration,that should be evaluated in a face to face office visit. You could have a foreign body in your eye as well as laceration and that needs to be looked at with a special instrument called a split lamp. Not all urgents cares have one but all eye doctors office do. Tape your eye shut and go see an eye doctor in the morning.    NOTE: If you entered your credit card information for this eVisit, you will not be charged. You may see a "hold" on your card for the $35 but that hold will drop off and you will not have a charge processed.  If you are having a true medical emergency please call 911.     For an urgent face to face visit, Phillip Bolton has four urgent care centers for your convenience:   . [facility]    [phone]                  Get Driving Directions  [address] [place], [place] [address] . 10 am to 8 pm Monday-Friday . 12 pm to 8 pm Saturday-Sunday   . [facility] at [facility]  [phone]                  Get Driving Directions  [address], [address] [place], [place] [address] . 8 am to 8 pm Monday-Friday . 9 am to 6 pm Saturday . 11 am to 6 pm Sunday   . [facility] at [place]  [phone]                  Get Driving Directions   [address] [place], [place] [address] . 8 am to 8 pm Monday-Friday . 8 am to 4 pm Saturday-Sunday    . [facility] at [facility] Directions  [phone]  [address] [place], [place] [address]  . Monday-Friday, 12 PM to 6 PM    Your e-visit answers were reviewed by a board certified advanced clinical practitioner to complete your personal care plan.  Thank you for using e-Visits.

## 2021-03-13 ENCOUNTER — Encounter (HOSPITAL_COMMUNITY): Payer: Self-pay

## 2021-03-13 ENCOUNTER — Other Ambulatory Visit: Payer: Self-pay

## 2021-03-13 ENCOUNTER — Emergency Department (HOSPITAL_COMMUNITY)
Admission: EM | Admit: 2021-03-13 | Discharge: 2021-03-13 | Disposition: A | Discharge status: Home / Self Care | Payer: Self-pay | Attending: Emergency Medicine | Admitting: Emergency Medicine

## 2021-03-13 ENCOUNTER — Emergency Department (HOSPITAL_COMMUNITY): Payer: Self-pay

## 2021-03-13 DIAGNOSIS — Z8616 Personal history of COVID-19: Secondary | ICD-10-CM | POA: Insufficient documentation

## 2021-03-13 DIAGNOSIS — K6289 Other specified diseases of anus and rectum: Secondary | ICD-10-CM | POA: Insufficient documentation

## 2021-03-13 DIAGNOSIS — F17210 Nicotine dependence, cigarettes, uncomplicated: Secondary | ICD-10-CM | POA: Insufficient documentation

## 2021-03-13 LAB — BASIC METABOLIC PANEL
Anion gap: 7 (ref 5–15)
BUN: 12 mg/dL (ref 6–20)
CO2: 28 mmol/L (ref 22–32)
Calcium: 9.4 mg/dL (ref 8.9–10.3)
Chloride: 103 mmol/L (ref 98–111)
Creatinine, Ser: 0.96 mg/dL (ref 0.61–1.24)
GFR, Estimated: >60 mL/min (ref >60)
Glucose, Bld: 142 mg/dL — ABNORMAL HIGH (ref 70–99)
Potassium: 3.7 mmol/L (ref 3.5–5.1)
Sodium: 138 mmol/L (ref 135–145)

## 2021-03-13 LAB — CBC WITH DIFFERENTIAL/PLATELET
Abs Immature Granulocytes: 0.02 10*3/uL (ref 0.00–0.07)
Basophils Absolute: 0 10*3/uL (ref 0.0–0.1)
Basophils Relative: 1 %
Eosinophils Absolute: 0 10*3/uL (ref 0.0–0.5)
Eosinophils Relative: 0 %
HCT: 45.7 % (ref 39.0–52.0)
Hemoglobin: 15.3 g/dL (ref 13.0–17.0)
Immature Granulocytes: 0 %
Lymphocytes Relative: 17 %
Lymphs Abs: 1.2 10*3/uL (ref 0.7–4.0)
MCH: 30.7 pg (ref 26.0–34.0)
MCHC: 33.5 g/dL (ref 30.0–36.0)
MCV: 91.8 fL (ref 80.0–100.0)
Monocytes Absolute: 0.7 10*3/uL (ref 0.1–1.0)
Monocytes Relative: 10 %
Neutro Abs: 5.2 10*3/uL (ref 1.7–7.7)
Neutrophils Relative %: 72 %
Platelets: 247 10*3/uL (ref 150–400)
RBC: 4.98 MIL/uL (ref 4.22–5.81)
RDW: 12.7 % (ref 11.5–15.5)
WBC: 7.2 10*3/uL (ref 4.0–10.5)
nRBC: 0 % (ref 0.0–0.2)

## 2021-03-13 MED ORDER — ONDANSETRON HCL 4 MG/2ML IJ SOLN
4.0000 mg | Freq: Once | INTRAMUSCULAR | Status: AC
  Administered 2021-03-13: 18:00:00 4 mg via INTRAVENOUS
  Filled 2021-03-13: qty 2

## 2021-03-13 MED ORDER — HYDROCORTISONE ACETATE 25 MG RE SUPP: 25.0000 mg | Freq: Two times a day (BID) | RECTAL | 0 refills | Status: AC

## 2021-03-13 MED ORDER — HYDROMORPHONE HCL 1 MG/ML IJ SOLN
1.0000 mg | INTRAMUSCULAR | Status: DC | PRN
  Administered 2021-03-13 (×2): 1 mg via INTRAVENOUS
  Filled 2021-03-13 (×2): qty 1

## 2021-03-13 MED ORDER — IOHEXOL 350 MG/ML SOLN
80.0000 mL | Freq: Once | INTRAVENOUS | Status: AC | PRN
  Administered 2021-03-13: 18:00:00 80 mL via INTRAVENOUS

## 2021-03-13 MED ORDER — AMOXICILLIN-POT CLAVULANATE 875-125 MG PO TABS: 1.0000 | ORAL_TABLET | Freq: Two times a day (BID) | ORAL | 0 refills | Status: DC

## 2021-03-13 MED ORDER — OXYCODONE-ACETAMINOPHEN 5-325 MG PO TABS: 1.0000 | ORAL_TABLET | Freq: Four times a day (QID) | ORAL | 0 refills | Status: DC | PRN

## 2021-03-13 MED ORDER — SODIUM CHLORIDE 0.9 % IV BOLUS
1000.0000 mL | Freq: Once | INTRAVENOUS | Status: AC
  Administered 2021-03-13: 18:00:00 1000 mL via INTRAVENOUS

## 2021-03-13 NOTE — Discharge Instructions (Addendum)
To help the wounds of the anal area heal, we are prescribing several medications.  These are to treat for inflammation, and infection.  It is not entirely clear what is causing the discomfort and bleeding.  Your blood counts are normal.  We are referring you to general surgery for further evaluation and treatment.  You can also see the GI doctor as well and are receiving information about that.  He will likely need to have a flexible sigmoidoscopy, to further evaluate and treat your condition.  It is important to keep your stools soft.  To do that, use a fiber product such as Benefiber, or Colace, 50 to 100 mg, twice a day.  To help the area stay clean, soak in a tub or sit in the sitz bath several times a day for 20 to 30 minutes.  Cleaning after defecation usually is helpful with something like a diaper wipe.  Return here if needed for problems.

## 2021-03-13 NOTE — ED Provider Notes (Signed)
Emergency Medicine Provider Triage Evaluation Note  Phillip Bolton , a 35 y.o. male  was evaluated in triage.  Pt complains of rectal pain and bleeding.  Patient has noted a "prolapse problem" with rectal bleeding and pain over the past 3 days.  He states that he has had a bulge from the rectal area.  He is unable to sit down because of this.  Does have a history of hemorrhoids.  Review of Systems  Positive: Rectal pain, bleeding Negative: Abdominal pain  Physical Exam  BP (!) 148/100 (BP Location: Right Arm)    Pulse 99    Resp 18    Ht 5\' 8"  (1.727 m)    Wt 68.9 kg    SpO2 99%    BMI 23.11 kg/m  Gen:   Awake, no distress   Resp:  Normal effort  MSK:   Moves extremities without difficulty  Other:  Rectal exam performed with chaperone, patient with soft tissue swelling, unclear if hemorrhoid present.  There is dried and clotted blood.  Medical Decision Making  Medically screening exam initiated at 4:01 PM.  Appropriate orders placed.  Phillip Bolton was informed that the remainder of the evaluation will be completed by another provider, this initial triage assessment does not replace that evaluation, and the importance of remaining in the ED until their evaluation is complete.     Reuel Boom, PA-C 03/13/21 1602    03/15/21, MD 03/15/21 [phone]

## 2021-03-13 NOTE — ED Triage Notes (Signed)
Patient reports that he has a rectal prolapse x 3 days and is unable to sit down. Patient reports rectal  bleeding.

## 2021-03-13 NOTE — ED Provider Notes (Signed)
[facility]-EMERGENCY DEPT Provider Note   CSN: [id number] Arrival date & time: 03/13/21  1530     History No chief complaint on file.   Phillip Bolton is a 35 y.o. male.  HPI He complains of rectal bleeding with pain, for 3 days, gradually worse.  He has been previously evaluated for this and referred to surgery but has not carried out that consultation.  He believes that he has hemorrhoids.  He states his bowel movements have been very painful but he did move his bowels this morning.  There was blood mixed with the stool at that time.  He denies fever, vomiting, dizziness or weakness.  There are no other known active modifying factors.    Past Medical History:  Diagnosis Date   COVID-19     There are no problems to display for this patient.   History reviewed. No pertinent surgical history.     Family History  Problem Relation Age of Onset   Lung cancer Other        grandfather   Colon cancer Neg Hx    Esophageal cancer Neg Hx    Pancreatic cancer Neg Hx    Stomach cancer Neg Hx     Social History   Tobacco Use   Smoking status: Some Days    Types: Cigarettes   Smokeless tobacco: Never  Vaping Use   Vaping Use: Never used  Substance Use Topics   Alcohol use: Yes   Drug use: No    Home Medications Prior to Admission medications   Medication Sig Start Date End Date Taking? Authorizing Provider  acetaminophen (TYLENOL) 500 MG tablet Take 1,000 mg by mouth every 6 (six) hours as needed for moderate pain or mild pain.   Yes [provider]  amoxicillin-clavulanate (AUGMENTIN) 875-125 MG tablet Take 1 tablet by mouth 2 (two) times daily. One po bid x 7 days 03/13/21  Yes Mancel Bale, MD  hydrocortisone (ANUSOL-HC) 25 MG suppository Place 1 suppository (25 mg total) rectally 2 (two) times daily. For 7 days 03/13/21  Yes Mancel Bale, MD  ibuprofen (ADVIL) 200 MG tablet Take 400 mg by mouth every 6 (six) hours as needed for mild  pain or moderate pain.   Yes [provider]  oxyCODONE-acetaminophen (PERCOCET/ROXICET) 5-325 MG tablet Take 1 tablet by mouth every 6 (six) hours as needed for severe pain. 03/13/21  Yes Mancel Bale, MD    Allergies    Patient has no known allergies.  Review of Systems   Review of Systems  All other systems reviewed and are negative.  Physical Exam Updated Vital Signs BP (!) 121/94 (BP Location: Right Arm)    Pulse 70    Temp 98.6 F (37 C) (Oral)    Resp 15    Ht 5\' 8"  (1.727 m)    Wt 68.9 kg    SpO2 100%    BMI 23.11 kg/m   Physical Exam Vitals and nursing note reviewed.  Constitutional:      Appearance: He is well-developed. He is not ill-appearing.  HENT:     Head: Normocephalic and atraumatic.     Right Ear: External ear normal.     Left Ear: External ear normal.     Nose: No congestion or rhinorrhea.     Mouth/Throat:     Pharynx: No oropharyngeal exudate or posterior oropharyngeal erythema.  Eyes:     Conjunctiva/sclera: Conjunctivae normal.     Pupils: Pupils are equal, round, and reactive  to light.  Neck:     Trachea: Phonation normal.  Cardiovascular:     Rate and Rhythm: Normal rate.  Pulmonary:     Effort: Pulmonary effort is normal.  Abdominal:     General: There is no distension.     Palpations: Abdomen is soft.     Tenderness: There is no abdominal tenderness.  Musculoskeletal:        General: Normal range of motion.     Cervical back: Normal range of motion and neck supple.     Comments: Deformed anus with moist bloody tissue and drainage indicative of infection.  This could be a presentation of proctitis, fistula or infected hemorrhoid tissue.  The area is exquisitely tender and was not able to digitally penetrate the anus to examine the rectum, because the patient was in too much pain.  Skin:    General: Skin is warm and dry.  Neurological:     Mental Status: He is alert and oriented to person, place, and time.     Cranial Nerves: No  cranial nerve deficit.     Sensory: No sensory deficit.     Motor: No abnormal muscle tone.     Coordination: Coordination normal.  Psychiatric:        Mood and Affect: Mood normal.        Behavior: Behavior normal.        Thought Content: Thought content normal.        Judgment: Judgment normal.    ED Results / Procedures / Treatments   Labs (all labs ordered are listed, but only abnormal results are displayed) Labs Reviewed  BASIC METABOLIC PANEL - Abnormal; Notable for the following components:      Result Value   Glucose, Bld 142 (*)    All other components within normal limits  CBC WITH DIFFERENTIAL/PLATELET    EKG None  Radiology CT Abdomen Pelvis W Contrast  Result Date: 03/13/2021 CLINICAL DATA:  Peritonitis or perforation suspected. Rectal pain and bleeding. EXAM: CT ABDOMEN AND PELVIS WITH CONTRAST TECHNIQUE: Multidetector CT imaging of the abdomen and pelvis was performed using the standard protocol following bolus administration of intravenous contrast. CONTRAST:  6mL OMNIPAQUE IOHEXOL 350 MG/ML SOLN COMPARISON:  None. FINDINGS: Lower chest: No acute abnormality. Hepatobiliary: There is a rounded hypodensity in the left lobe of the liver which is too small to characterize, likely a small cyst or hemangioma. The gallbladder and bile ducts are within normal limits. Pancreas: Unremarkable. No pancreatic ductal dilatation or surrounding inflammatory changes. Spleen: Normal in size without focal abnormality. Adrenals/Urinary Tract: Adrenal glands are unremarkable. Kidneys are normal, without renal calculi, focal lesion, or hydronephrosis. Bladder is within normal limits. The bladder is distended. Stomach/Bowel: There is some wall thickening of jejunal loops the central abdomen. There is no surrounding inflammatory stranding. There is no bowel obstruction. Stomach is within normal limits. Appendix is within normal limits. Vascular/Lymphatic: No significant vascular findings are  present. No enlarged abdominal or pelvic lymph nodes. Reproductive: Prostate is unremarkable. Other: No abdominal wall hernia or abnormality. No abdominopelvic ascites. Musculoskeletal: No acute or significant osseous findings. IMPRESSION: 1. Wall thickening of jejunal loops suspicious for nonspecific enteritis. No bowel obstruction or free air. 2.  Distended bladder. Electronically Signed   By: Ronney Asters M.D.   On: 03/13/2021 18:51    Procedures Procedures   Medications Ordered in ED Medications  HYDROmorphone (DILAUDID) injection 1 mg (1 mg Intravenous Given 03/13/21 1841)  sodium chloride 0.9 % bolus 1,000  mL (1,000 mLs Intravenous Bolus 03/13/21 1732)  ondansetron (ZOFRAN) injection 4 mg (4 mg Intravenous Given 03/13/21 1732)  iohexol (OMNIPAQUE) 350 MG/ML injection 80 mL (80 mLs Intravenous Contrast Given 03/13/21 1824)    ED Course  I have reviewed the triage vital signs and the nursing notes.  Pertinent labs & imaging results that were available during my care of the patient were reviewed by me and considered in my medical decision making (see chart for details).  Clinical Course as of 03/13/21 2050  Mon Mar 13, 2021  1926 Case discussed with Dr. Ewing Schlein, on-call for GI.  He states that follow-up for this case could be either with general surgery, or his service.  Neck steps would include next steps would include flexible sigmoidoscopy.  In meantime has been symptoms can be treated and strongly encouraged follow-up. [EW]    Clinical Course User Index [EW] Mancel Bale, MD   MDM Rules/Calculators/A&P                          Patient Vitals for the past 24 hrs:  BP Temp Temp src Pulse Resp SpO2 Height Weight  03/13/21 2018 (!) 121/94 -- -- 70 15 100 % -- --  03/13/21 2013 -- -- -- 69 -- -- -- --  03/13/21 1922 (!) 131/95 -- -- 77 17 99 % -- --  03/13/21 1900 (!) 128/100 -- -- 70 -- 99 % -- --  03/13/21 1845 114/83 -- -- 85 16 97 % -- --  03/13/21 1830 137/73 -- -- 93 16 99  % -- --  03/13/21 1815 124/75 -- -- 87 17 99 % -- --  03/13/21 1755 122/75 -- -- 93 18 97 % 5\' 8"  (1.727 m) 68.9 kg  03/13/21 1715 (!) 145/124 -- -- 99 16 99 % -- --  03/13/21 1710 -- 98.6 F (37 C) Oral -- -- -- -- --  03/13/21 1547 -- -- -- -- -- -- 5\' 8"  (1.727 m) 68.9 kg  03/13/21 1540 (!) 148/100 -- Oral 99 18 99 % -- --    7:27 PM Reevaluation with update and discussion. After initial assessment and treatment, an updated evaluation reveals he is more comfortable after medication in the ED.  He has not expressed any further concerns or problems.   Medical Decision Making:  This patient is presenting for evaluation of anal/rectal infection or inflammation, which does require a range of treatment options, and is a complaint that involves a high risk of morbidity and mortality. The differential diagnoses include ulcerative colitis, proctitis, infected hemorrhoid. I decided to review old records, and in summary Young adult male presenting with recurrent symptoms associated with bleeding and pain with defecation.  I did not require additional historical information from anyone.  Clinical Laboratory Tests Ordered, included CBC and Metabolic panel. Review indicates normal except glucose mildly elevated. Radiologic Tests Ordered, included CT abdomen pelvis.  I independently Visualized: Radiographic images, which show nonspecific small bowel, mild, no clear evidence for proctitis    Critical Interventions-clinical evaluation, laboratory testing, radiography, medication treatment, consultation with gastroenterology and general surgery, by telephone  After These Interventions, the Patient was reevaluated and was found with unrelenting pain despite sequential dosing of narcotics.  Doubt chest syndrome, hemodynamic instability or metabolic disorders.  CRITICAL CARE-yes Performed by: 03/15/21  Nursing Notes Reviewed/ Care Coordinated Applicable Imaging  Reviewed Interpretation of Laboratory Data incorporated into ED treatment  The patient appears reasonably screened and/or  stabilized for discharge and I doubt any other medical condition or other [facility] requiring further screening, evaluation, or treatment in the ED at this time prior to discharge.  Plan: Home Medications-OTC as needed; Home Treatments-sitz bath, wound care; return here if the recommended treatment, does not improve the symptoms; Recommended follow up-General surgery, or GI as soon as possible        Final Clinical Impression(s) / ED Diagnoses Final diagnoses:  Anal or rectal pain    Rx / DC Orders ED Discharge Orders          Ordered    hydrocortisone (ANUSOL-HC) 25 MG suppository  2 times daily        03/13/21 2049    amoxicillin-clavulanate (AUGMENTIN) 875-125 MG tablet  2 times daily        03/13/21 2049    oxyCODONE-acetaminophen (PERCOCET/ROXICET) 5-325 MG tablet  Every 6 hours PRN        03/13/21 2049             Daleen Bo, MD 03/14/21 [phone]

## 2021-11-15 ENCOUNTER — Emergency Department (HOSPITAL_COMMUNITY): Payer: Self-pay

## 2021-11-15 ENCOUNTER — Other Ambulatory Visit: Payer: Self-pay

## 2021-11-15 ENCOUNTER — Encounter (HOSPITAL_COMMUNITY): Payer: Self-pay | Admitting: Emergency Medicine

## 2021-11-15 ENCOUNTER — Emergency Department (HOSPITAL_COMMUNITY)
Admission: EM | Admit: 2021-11-15 | Discharge: 2021-11-15 | Disposition: A | Discharge status: Home / Self Care | Payer: Self-pay | Attending: Emergency Medicine | Admitting: Emergency Medicine

## 2021-11-15 DIAGNOSIS — L02611 Cutaneous abscess of right foot: Secondary | ICD-10-CM | POA: Insufficient documentation

## 2021-11-15 DIAGNOSIS — Z4800 Encounter for change or removal of nonsurgical wound dressing: Secondary | ICD-10-CM | POA: Insufficient documentation

## 2021-11-15 LAB — COMPREHENSIVE METABOLIC PANEL
ALT: 18 U/L (ref 0–44)
AST: 29 U/L (ref 15–41)
Albumin: 3.7 g/dL (ref 3.5–5.0)
Alkaline Phosphatase: 50 U/L (ref 38–126)
Anion gap: 7 (ref 5–15)
BUN: 9 mg/dL (ref 6–20)
CO2: 27 mmol/L (ref 22–32)
Calcium: 9.2 mg/dL (ref 8.9–10.3)
Chloride: 103 mmol/L (ref 98–111)
Creatinine, Ser: 0.96 mg/dL (ref 0.61–1.24)
GFR, Estimated: >60 mL/min (ref >60)
Glucose, Bld: 152 mg/dL — ABNORMAL HIGH (ref 70–99)
Potassium: 3.7 mmol/L (ref 3.5–5.1)
Sodium: 137 mmol/L (ref 135–145)
Total Bilirubin: 0.9 mg/dL (ref 0.3–1.2)
Total Protein: 6.8 g/dL (ref 6.5–8.1)

## 2021-11-15 LAB — CBC WITH DIFFERENTIAL/PLATELET
Abs Immature Granulocytes: 0.04 10*3/uL (ref 0.00–0.07)
Basophils Absolute: 0.1 10*3/uL (ref 0.0–0.1)
Basophils Relative: 1 %
Eosinophils Absolute: 0 10*3/uL (ref 0.0–0.5)
Eosinophils Relative: 0 %
HCT: 44.5 % (ref 39.0–52.0)
Hemoglobin: 15 g/dL (ref 13.0–17.0)
Immature Granulocytes: 0 %
Lymphocytes Relative: 9 %
Lymphs Abs: 0.8 10*3/uL (ref 0.7–4.0)
MCH: 31.2 pg (ref 26.0–34.0)
MCHC: 33.7 g/dL (ref 30.0–36.0)
MCV: 92.5 fL (ref 80.0–100.0)
Monocytes Absolute: 0.8 10*3/uL (ref 0.1–1.0)
Monocytes Relative: 9 %
Neutro Abs: 7.1 10*3/uL (ref 1.7–7.7)
Neutrophils Relative %: 81 %
Platelets: 278 10*3/uL (ref 150–400)
RBC: 4.81 MIL/uL (ref 4.22–5.81)
RDW: 12.6 % (ref 11.5–15.5)
WBC: 8.9 10*3/uL (ref 4.0–10.5)
nRBC: 0 % (ref 0.0–0.2)

## 2021-11-15 LAB — LACTIC ACID, PLASMA: Lactic Acid, Venous: 1.8 mmol/L (ref 0.5–1.9)

## 2021-11-15 MED ORDER — METRONIDAZOLE 500 MG/100ML IV SOLN
500.0000 mg | Freq: Once | INTRAVENOUS | Status: AC
  Administered 2021-11-15: 500 mg via INTRAVENOUS
  Filled 2021-11-15: qty 100

## 2021-11-15 MED ORDER — IOHEXOL 300 MG/ML  SOLN
75.0000 mL | Freq: Once | INTRAMUSCULAR | Status: AC | PRN
  Administered 2021-11-15: 75 mL via INTRAVENOUS

## 2021-11-15 MED ORDER — ONDANSETRON HCL 4 MG/2ML IJ SOLN
4.0000 mg | Freq: Once | INTRAMUSCULAR | Status: AC
  Administered 2021-11-15: 4 mg via INTRAVENOUS
  Filled 2021-11-15: qty 2

## 2021-11-15 MED ORDER — HYDROCODONE-ACETAMINOPHEN 5-325 MG PO TABS: 1.0000 | ORAL_TABLET | ORAL | 0 refills | Status: AC | PRN

## 2021-11-15 MED ORDER — SODIUM CHLORIDE 0.9 % IV SOLN
2.0000 g | Freq: Once | INTRAVENOUS | Status: AC
  Administered 2021-11-15: 2 g via INTRAVENOUS
  Filled 2021-11-15: qty 20

## 2021-11-15 MED ORDER — CIPROFLOXACIN HCL 500 MG PO TABS: 500.0000 mg | ORAL_TABLET | Freq: Two times a day (BID) | ORAL | 0 refills | Status: AC

## 2021-11-15 MED ORDER — SODIUM CHLORIDE 0.9 % IV BOLUS
1000.0000 mL | Freq: Once | INTRAVENOUS | Status: AC
  Administered 2021-11-15: 1000 mL via INTRAVENOUS

## 2021-11-15 MED ORDER — MORPHINE SULFATE (PF) 4 MG/ML IV SOLN
4.0000 mg | Freq: Once | INTRAVENOUS | Status: AC
  Administered 2021-11-15: 4 mg via INTRAVENOUS
  Filled 2021-11-15: qty 1

## 2021-11-15 MED ORDER — CEPHALEXIN 500 MG PO CAPS
500.0000 mg | ORAL_CAPSULE | Freq: Four times a day (QID) | ORAL | 0 refills | Status: AC
Start: 2021-11-15 — End: 2021-11-25

## 2021-11-15 NOTE — ED Notes (Signed)
Post op shoe applied. Pt given crutches and instructed proper use of crutches. Pt demonstrated proper usage of crutches.

## 2021-11-15 NOTE — ED Provider Notes (Signed)
[facility] EMERGENCY DEPARTMENT Provider Note   CSN: [id number] Arrival date & time: 11/15/21  1155     History  Chief Complaint  Patient presents with   Wound Check    Phillip Bolton is a 36 y.o. male.  36 year old male with complaint of right foot pain and swelling with purulent drainage.  Patient states that he noticed pain in his fifth toe about 2 weeks ago while he was outside working although denies any injury to the foot, denies stepping on anything or concern for retained foreign body.  He is not a diabetic.  Notes swelling and pain have gotten progressively worse to the point where now he cannot put his shoe on his foot due to swelling and pain.  He denies fevers.  Wife notes drainage from the foot with strong foul odor.       Home Medications Prior to Admission medications   Medication Sig Start Date End Date Taking? Authorizing Provider  cephALEXin (KEFLEX) 500 MG capsule Take 1 capsule (500 mg total) by mouth 4 (four) times daily for 10 days. 11/15/21 11/25/21 Yes Jeannie Fend, PA-C  ciprofloxacin (CIPRO) 500 MG tablet Take 1 tablet (500 mg total) by mouth every 12 (twelve) hours for 10 days. 11/15/21 11/25/21 Yes Jeannie Fend, PA-C  HYDROcodone-acetaminophen (NORCO/VICODIN) 5-325 MG tablet Take 1 tablet by mouth every 4 (four) hours as needed. 11/15/21  Yes Jeannie Fend, PA-C  hydrocortisone (ANUSOL-HC) 25 MG suppository Place 1 suppository (25 mg total) rectally 2 (two) times daily. For 7 days 03/13/21   Mancel Bale, MD  ibuprofen (ADVIL) 200 MG tablet Take 400 mg by mouth every 6 (six) hours as needed for mild pain or moderate pain.    [provider]      Allergies    Patient has no known allergies.    Review of Systems   Review of Systems Negative except as per HPI Physical Exam Updated Vital Signs BP 124/78   Pulse 82   Temp 98.9 F (37.2 C) (Oral)   Resp 17   SpO2 100%  Physical Exam Vitals and nursing note reviewed.   Constitutional:      General: He is not in acute distress.    Appearance: He is well-developed. He is not diaphoretic.  HENT:     Head: Normocephalic and atraumatic.  Cardiovascular:     Pulses: Normal pulses.  Pulmonary:     Effort: Pulmonary effort is normal.  Musculoskeletal:        General: Swelling and tenderness present.  Feet:     Comments: Swelling to foot and ankle, erythema near 4th-5th toes, purulent drainage present between fourth and fifth toes. Skin:    General: Skin is warm.     Findings: Erythema present.  Neurological:     Mental Status: He is alert and oriented to person, place, and time.     Sensory: No sensory deficit.     Motor: No weakness.  Psychiatric:        Behavior: Behavior normal.     ED Results / Procedures / Treatments   Labs (all labs ordered are listed, but only abnormal results are displayed) Labs Reviewed  COMPREHENSIVE METABOLIC PANEL - Abnormal; Notable for the following components:      Result Value   Glucose, Bld 152 (*)    All other components within normal limits  LACTIC ACID, PLASMA  CBC WITH DIFFERENTIAL/PLATELET    EKG None  Radiology CT FOOT  RIGHT W CONTRAST  Result Date: 11/15/2021 CLINICAL DATA:  Right foot redness and wound. EXAM: CT OF THE LOWER RIGHT EXTREMITY WITH CONTRAST TECHNIQUE: Multidetector CT imaging of the lower right extremity was performed according to the standard protocol following intravenous contrast administration. RADIATION DOSE REDUCTION: This exam was performed according to the departmental dose-optimization program which includes automated exposure control, adjustment of the mA and/or kV according to patient size and/or use of iterative reconstruction technique. CONTRAST:  88mL OMNIPAQUE IOHEXOL 300 MG/ML  SOLN COMPARISON:  Right foot x-rays from same day. FINDINGS: Bones/Joint/Cartilage No bony destruction or periosteal reaction. No fracture or dislocation. Joint spaces are preserved. No joint  effusion. Ligaments Ligaments are suboptimally evaluated by CT. Muscles and Tendons Grossly intact.  No tenosynovitis.  No muscle atrophy. Soft tissue Tiny foci of subcutaneous emphysema at the plantar aspect of the fifth toe. No rim enhancing fluid collection. No soft tissue mass. IMPRESSION: 1. Right foot cellulitis with tiny foci of subcutaneous emphysema at the plantar aspect of the fifth toe, likely related to known wound. No abscess or CT evidence of osteomyelitis. Electronically Signed   By: Obie Dredge M.D.   On: 11/15/2021 18:59   DG Foot Complete Right  Result Date: 11/15/2021 CLINICAL DATA:  injury or infection; possible infection of the right foot. Pain and swelling for the past 2 weeks. Possible insect bite. EXAM: RIGHT FOOT COMPLETE - 3+ VIEW COMPARISON:  None Available. FINDINGS: There is no evidence of fracture or dislocation. There is no evidence of arthropathy or other focal bone abnormality. There is moderate soft tissue swelling of the foot seen. No soft tissue emphysema. IMPRESSION: Moderate soft tissue swelling of the foot. Osseous structures are unremarkable. Electronically Signed   By: Marjo Bicker M.D.   On: 11/15/2021 13:18    Procedures Procedures    Medications Ordered in ED Medications  sodium chloride 0.9 % bolus 1,000 mL (0 mLs Intravenous Stopped 11/15/21 1911)  cefTRIAXone (ROCEPHIN) 2 g in sodium chloride 0.9 % 100 mL IVPB (0 g Intravenous Stopped 11/15/21 1911)    And  metroNIDAZOLE (FLAGYL) IVPB 500 mg (0 mg Intravenous Stopped 11/15/21 1911)  ondansetron (ZOFRAN) injection 4 mg (4 mg Intravenous Given 11/15/21 1757)  morphine (PF) 4 MG/ML injection 4 mg (4 mg Intravenous Given 11/15/21 1758)  iohexol (OMNIPAQUE) 300 MG/ML solution 75 mL (75 mLs Intravenous Contrast Given 11/15/21 1811)    ED Course/ Medical Decision Making/ A&P                           Medical Decision Making Amount and/or Complexity of Data Reviewed Labs: ordered. Radiology:  ordered.  Risk Prescription drug management.   This patient presents to the ED for concern of right foot pain and swelling with purulent drainage, this involves an extensive number of treatment options, and is a complaint that carries with it a high risk of complications and morbidity.  The differential diagnosis includes foreign body, abscess, cellulitis, osteomyelitis   Co morbidities that complicate the patient evaluation  Note PCP, denies significant history, specifically no history of diabetes, is a smoker   Additional history obtained:  Additional history obtained from wife at bedside, contributes to history as above External records from outside source obtained and reviewed including prior labs from 03/14/2021 used for comparison   Lab Tests:  I Ordered, and personally interpreted labs.  The pertinent results include: CBC with normal white count, lactic acid normal, CMP with nonfasting  glucose of 152   Imaging Studies ordered:  I ordered imaging studies including x-ray right foot, CT right foot I independently visualized and interpreted imaging which showed x-ray negative for foreign body or acute bony injury, does show soft tissue swelling.  CT with swelling, no obvious fluid collection.  Suspect gas and soft tissues is related to open, draining area I agree with the radiologist interpretation  Consultations Obtained:  I requested consultation with the ER attending, Dr. Freida Busman,  and discussed lab and imaging findings as well as pertinent plan - they recommend: CT foot, antibiotic coverage including Pseudomonas fridge   Problem List / ED Course / Critical interventions / Medication management  36 year old male with complaint of right foot infection as above.  Found to purulent drainage between fourth and fifth toes with soft tissue swelling to the ankle and erythema likely at the toes.  Is negative for bony injury or obvious foreign body.  CT is negative for significant  fluid collection requiring admission and further debridement.  Patient is treated with IV antibiotics, discharged on Cipro and Keflex.  Advised to use warm salt soaks, dry foot thoroughly.  Follow-up with podiatry with return to ER precautions.  He is discharged with a postop shoe and crutches. I ordered medication including IV antibiotics, morphine, Zofran for pain, foot infection Reevaluation of the patient after these medicines showed that the patient improved I have reviewed the patients home medicines and have made adjustments as needed   Social Determinants of Health:  No PCP, referred to podiatry for follow-up   Test / Admission - Considered:  Consider admission for IV antibiotics however patient is afebrile with normal white count, no history of diabetes.  Will treat with oral antibiotics and referred to podiatry for close follow-up         Final Clinical Impression(s) / ED Diagnoses Final diagnoses:  Foot abscess, right    Rx / DC Orders ED Discharge Orders          Ordered    ciprofloxacin (CIPRO) 500 MG tablet  Every 12 hours        11/15/21 1925    cephALEXin (KEFLEX) 500 MG capsule  4 times daily        11/15/21 1925    HYDROcodone-acetaminophen (NORCO/VICODIN) 5-325 MG tablet  Every 4 hours PRN        11/15/21 1925              Alden Hipp 11/15/21 2255    Lorre Nick, MD 11/17/21 2106

## 2021-11-15 NOTE — ED Provider Triage Note (Signed)
Emergency Medicine Provider Triage Evaluation Note  Phillip Bolton , a 36 y.o. male  was evaluated in triage.  Pt complains of wound to right fifth toe x2 weeks.  Patient states he was working outside and began feeling pain in his toe.  Patient notes wound has gotten worse.  No fever or chills.  Patient admits to a remote injury to right fifth toe.  Review of Systems  Positive: wound Negative: fever  Physical Exam  BP (!) 155/99 (BP Location: Right Arm)   Pulse 93   Temp 98.3 F (36.8 C)   Resp 17   SpO2 97%  Gen:   Awake, no distress   Resp:  Normal effort  MSK:   Moves extremities without difficulty  Other:  Edematous and erythematous right foot, most significant around right 5th toe  Medical Decision Making  Medically screening exam initiated at 12:38 PM.  Appropriate orders placed.  Phillip Bolton was informed that the remainder of the evaluation will be completed by another provider, this initial triage assessment does not replace that evaluation, and the importance of remaining in the ED until their evaluation is complete.  Labs  X-ray   Mannie Stabile, [place] 11/15/21 1239

## 2021-11-15 NOTE — ED Triage Notes (Signed)
Patient here with complaint of wound on his right foot that he first noticed two weeks ago. Patient states he was outside working when he started feeling pain in his foot but patient is unsure of what caused the wound. Patient is alert, oriented, afebrile, and in no apparent distress at this time.

## 2021-11-15 NOTE — ED Provider Notes (Signed)
I provided a substantive portion of the care of this patient.  I personally performed the entirety of the medical decision making for this encounter.      36 year old male presents with right foot redness along with draining wound.  No fever or chills.  On exam, the foot is edematous and erythematous.  Does have some purulent drainage noted between fourth and fifth toes.  Will obtain CT.  Will start IV antibiotics.   Lorre Nick, MD 11/15/21 1620

## 2021-11-15 NOTE — Discharge Instructions (Signed)
Follow-up with podiatry, call to schedule appointment tomorrow morning. Can soak foot in warm Epsom salt water for 20 minutes at a time.  Dry foot afterwards well. Weight-bear as tolerated, use crutches. Prescription for Norco for pain as needed as prescribed, do not drive or operate machinery if taking this medication.  Take antibiotics as prescribed.  Turn to ER for fevers, worsening pain or concerning symptoms.

## 2023-07-11 IMAGING — CT CT ABD-PELV W/ CM
2 of 4 series · 15 of 46 positions shown, 17 images · IV contrast (omnipaque)
Comparison: None.

CLINICAL DATA: Peritonitis or perforation suspected. Rectal pain
and bleeding.

EXAM:
CT ABDOMEN AND PELVIS WITH CONTRAST
TECHNIQUE: Multidetector CT imaging of the abdomen and pelvis was performed
using the standard protocol following bolus administration of
intravenous contrast.
CONTRAST:  80mL OMNIPAQUE IOHEXOL 350 MG/ML SOLN

[Series 2: axial st · axial · 0.73mm/px · z∈[-489,-34]mm · 12 of 105 slices shown, 14 images]
[im 7/105  soft-tissue]
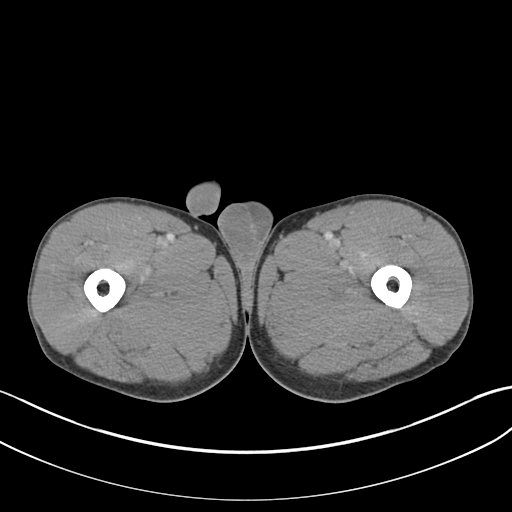
[im 7/105  bone]
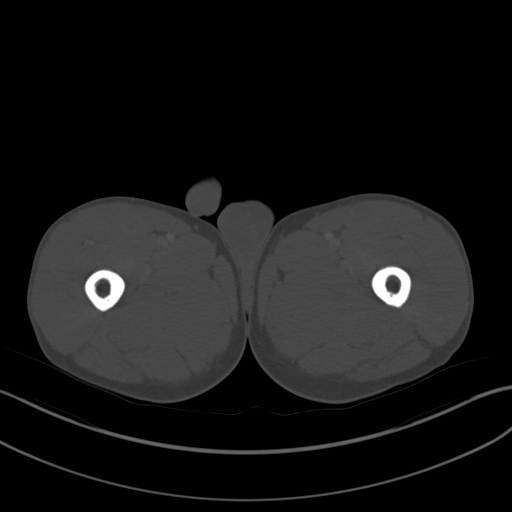
[im 19/105  soft-tissue]
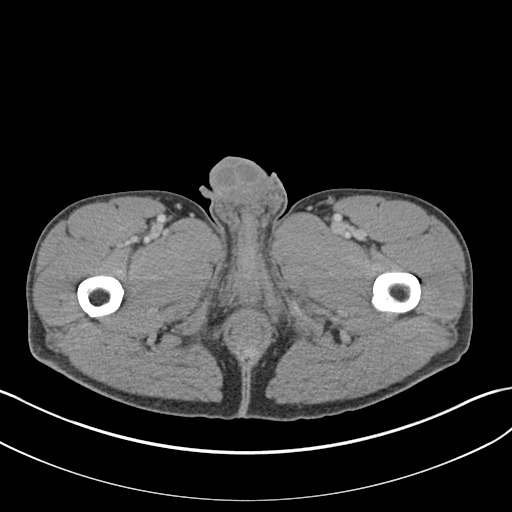
[im 25/105  soft-tissue]
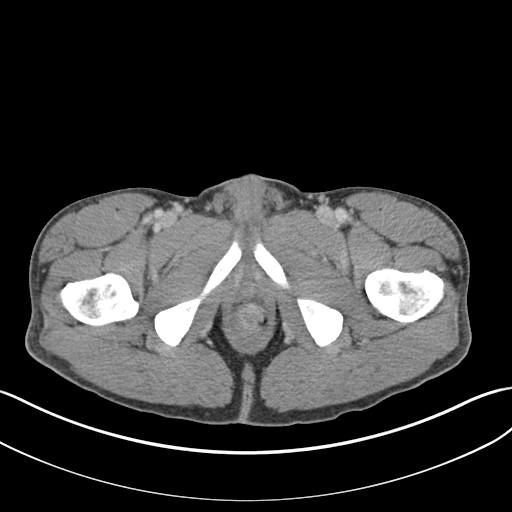
[im 31/105  soft-tissue]
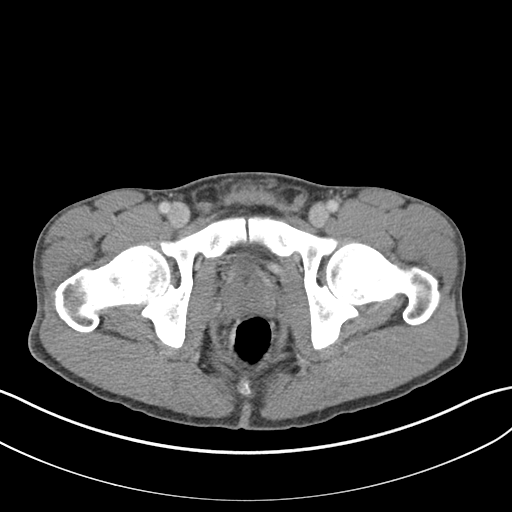
[im 43/105  soft-tissue]
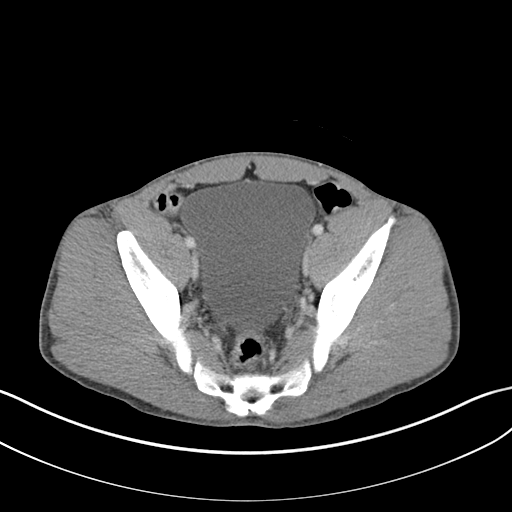
[im 49/105  soft-tissue]
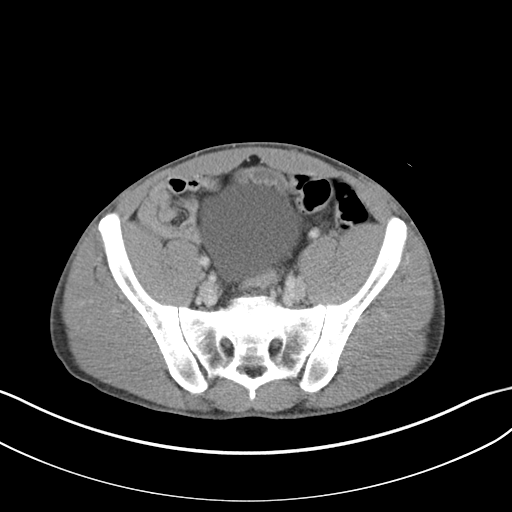
[im 56/105  soft-tissue]
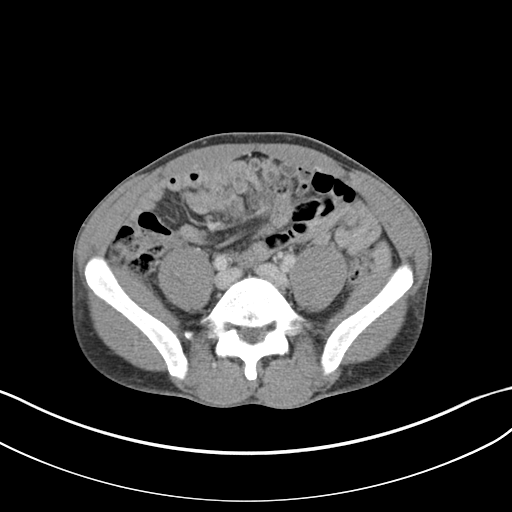
[im 68/105  soft-tissue]
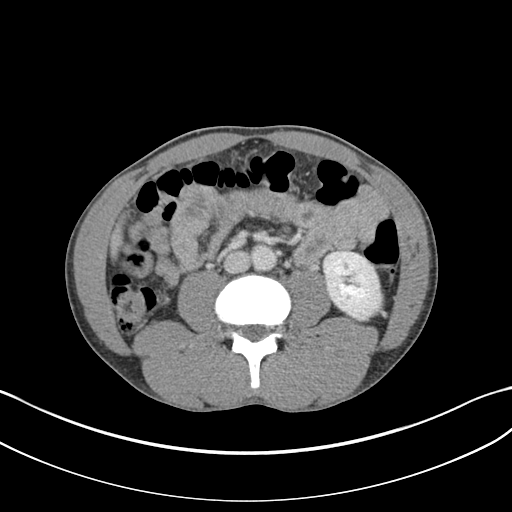
[im 74/105  soft-tissue]
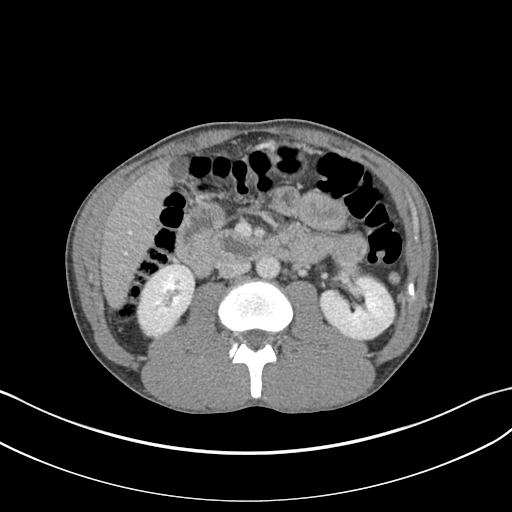
[im 74/105  bone]
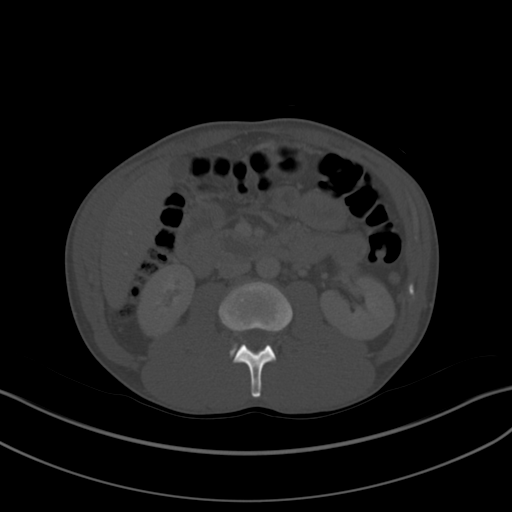
[im 80/105  soft-tissue]
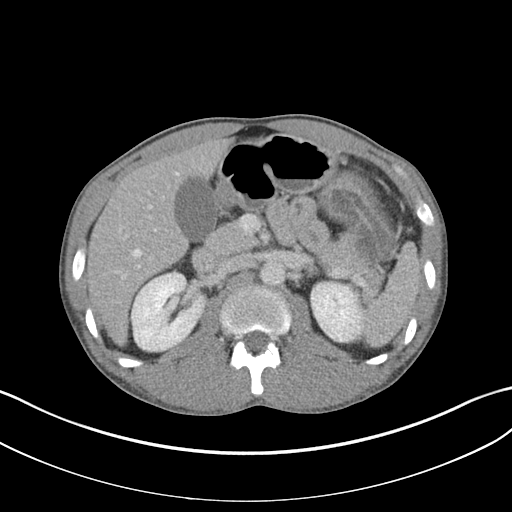
[im 92/105  soft-tissue]
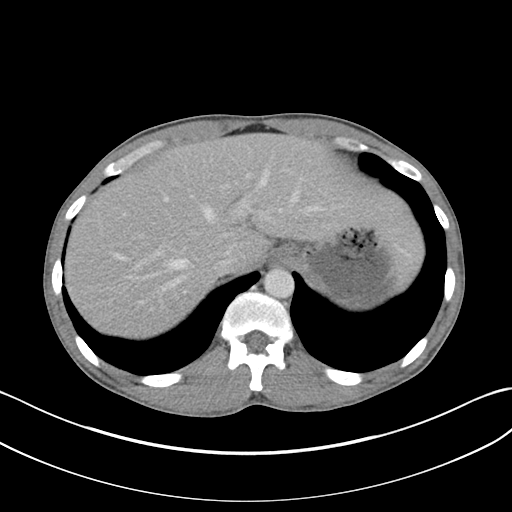
[im 98/105  soft-tissue]
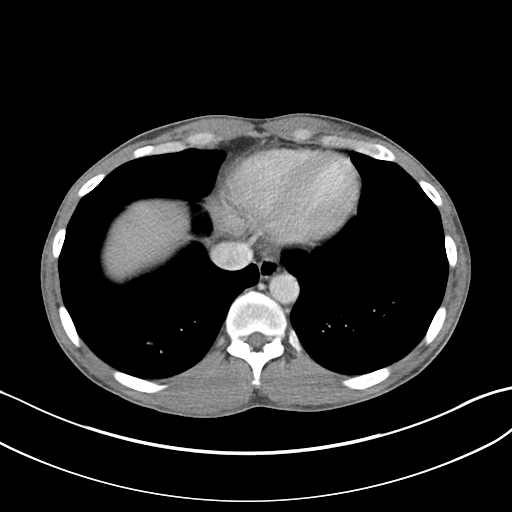

[Series 4: coronal st · coronal · 0.71mm/px · 3 of 117 slices shown]
[im 39/117  soft-tissue]
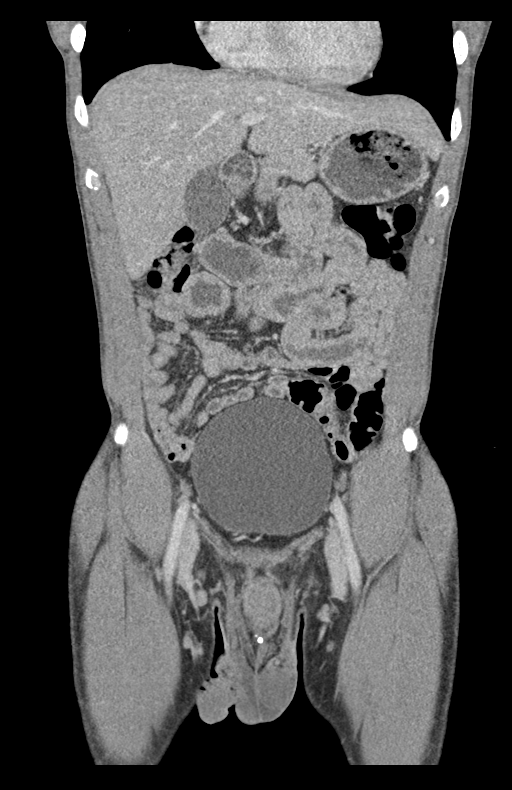
[im 52/117  soft-tissue]
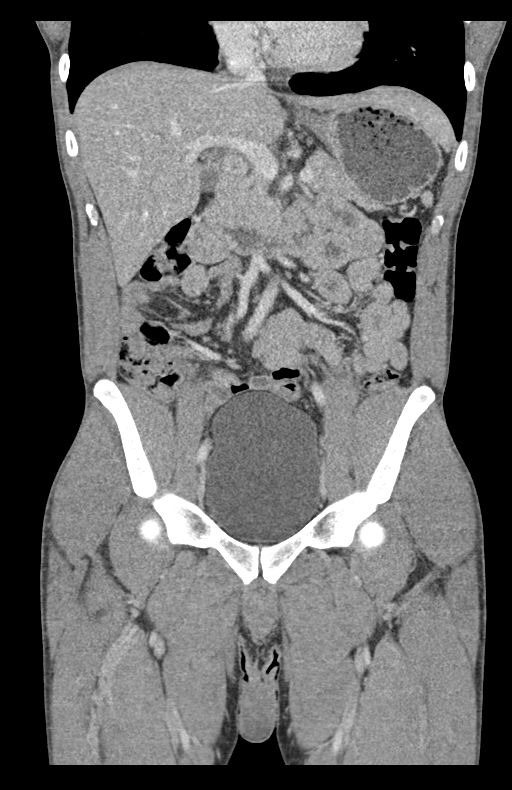
[im 65/117  soft-tissue]
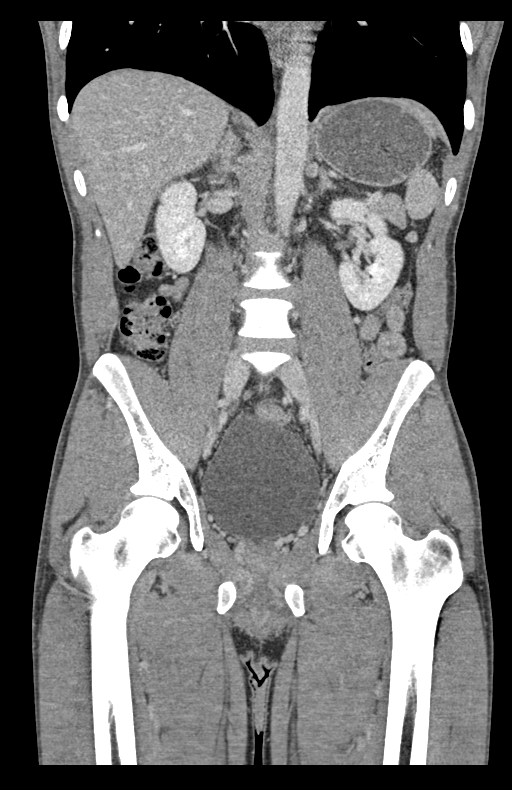

[15 of 46 positions shown; findings below may reference images not displayed]

FINDINGS: Lower chest: No acute abnormality.

Hepatobiliary: There is a rounded hypodensity in the left lobe of
the liver which is too small to characterize, likely a small cyst or
hemangioma. The gallbladder and bile ducts are within normal limits.

Pancreas: Unremarkable. No pancreatic ductal dilatation or
surrounding inflammatory changes.

Spleen: Normal in size without focal abnormality.

Adrenals/Urinary Tract: Adrenal glands are unremarkable. Kidneys are
normal, without renal calculi, focal lesion, or hydronephrosis.
Bladder is within normal limits. The bladder is distended.

Stomach/Bowel: There is some wall thickening of jejunal loops the
central abdomen. There is no surrounding inflammatory stranding.
There is no bowel obstruction. Stomach is within normal limits.
Appendix is within normal limits.

Vascular/Lymphatic: No significant vascular findings are present. No
enlarged abdominal or pelvic lymph nodes.

Reproductive: Prostate is unremarkable.

Other: No abdominal wall hernia or abnormality. No abdominopelvic
ascites.

Musculoskeletal: No acute or significant osseous findings.
IMPRESSION: 1. Wall thickening of jejunal loops suspicious for nonspecific
enteritis. No bowel obstruction or free air.

2.  Distended bladder.
